# Patient Record
Sex: Female | Born: 1985 | Race: White | Hispanic: No | Marital: Single | Smoking: Never smoker
Health system: Southern US, Community
[De-identification: ages and names within clinical notes are randomized; demographics above are authoritative.]

## PROBLEM LIST (undated history)

## (undated) DIAGNOSIS — F1090 Alcohol use, unspecified, uncomplicated: Secondary | ICD-10-CM

## (undated) DIAGNOSIS — F411 Generalized anxiety disorder: Secondary | ICD-10-CM

## (undated) DIAGNOSIS — F1990 Other psychoactive substance use, unspecified, uncomplicated: Secondary | ICD-10-CM

---
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
## 2004-05-20 ENCOUNTER — Emergency Department (HOSPITAL_COMMUNITY): Admission: EM | Admit: 2004-05-20 | Discharge: 2004-05-21 | Payer: Self-pay | Admitting: *Deleted

## 2008-09-14 ENCOUNTER — Other Ambulatory Visit: Admission: RE | Admit: 2008-09-14 | Discharge: 2008-09-14 | Payer: Self-pay | Admitting: Family Medicine

## 2009-06-21 ENCOUNTER — Ambulatory Visit (HOSPITAL_COMMUNITY): Admission: RE | Admit: 2009-06-21 | Discharge: 2009-06-21 | Payer: Self-pay | Admitting: Obstetrics

## 2009-06-27 ENCOUNTER — Ambulatory Visit (HOSPITAL_COMMUNITY): Admission: RE | Admit: 2009-06-27 | Discharge: 2009-06-27 | Payer: Self-pay | Admitting: Obstetrics

## 2009-07-25 ENCOUNTER — Ambulatory Visit (HOSPITAL_COMMUNITY): Admission: RE | Admit: 2009-07-25 | Discharge: 2009-07-25 | Payer: Self-pay | Admitting: Obstetrics

## 2009-09-27 ENCOUNTER — Inpatient Hospital Stay (HOSPITAL_COMMUNITY): Admission: AD | Admit: 2009-09-27 | Discharge: 2009-10-03 | Payer: Self-pay | Admitting: Obstetrics

## 2009-09-30 ENCOUNTER — Encounter: Payer: Self-pay | Admitting: Obstetrics

## 2009-11-17 IMAGING — US US OB DETAIL+14 WK
1 series · 14 of 28 positions shown · non-contrast
Comparison: none

OBSTETRICAL ULTRASOUND:
 This ultrasound exam was performed in the [HOSPITAL] Ultrasound Department.  The OB US report was generated in the AS system, and faxed to the ordering physician.  This report is also available in [REDACTED] PACS.

[Series 1: us ob detail +14 wk · 53 acquisitions, 14 frames shown]
[im 2/53]
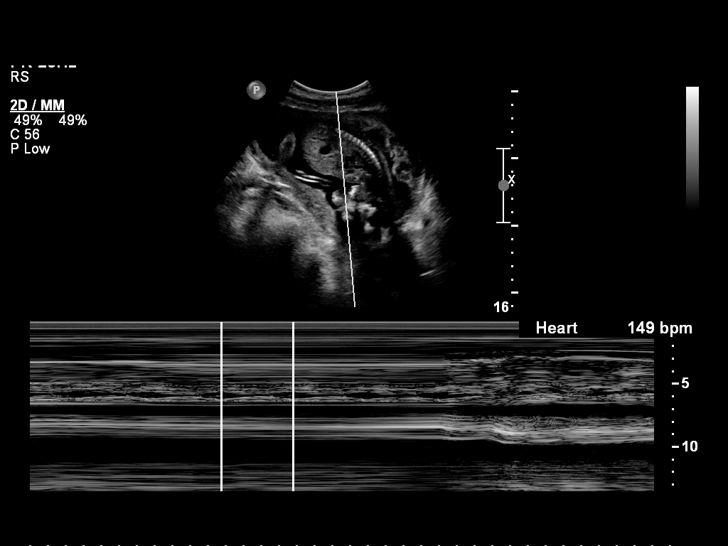
[im 6/53]
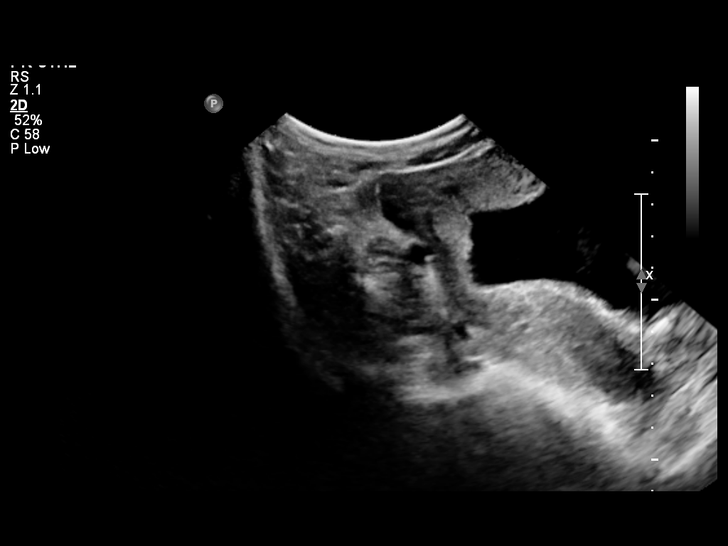
[im 10/53]
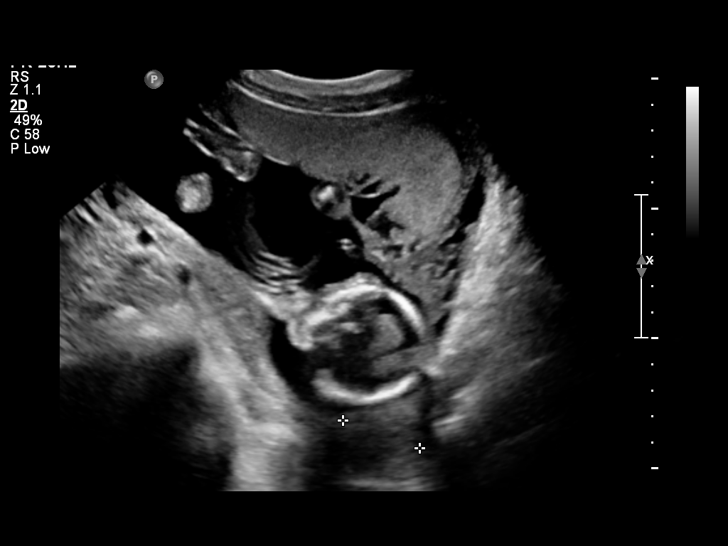
[im 14/53]
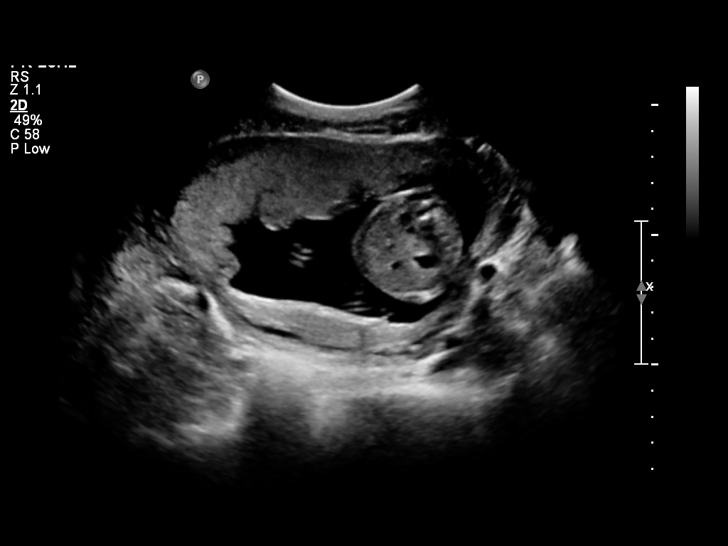
[im 18/53]
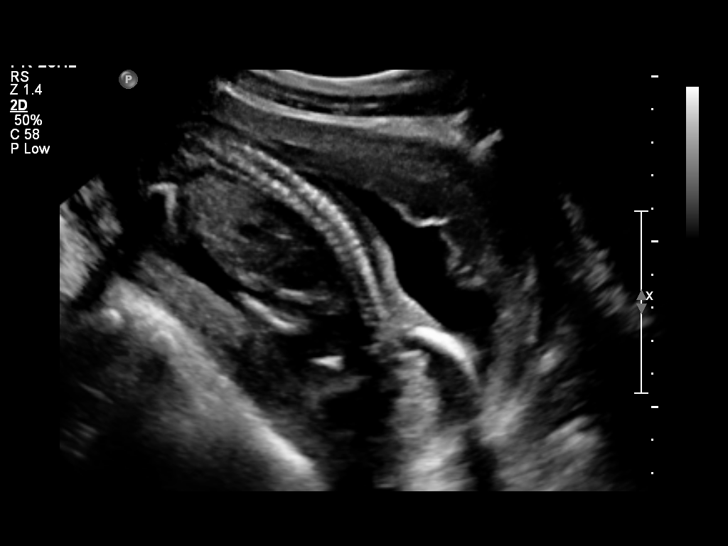
[im 22/53]
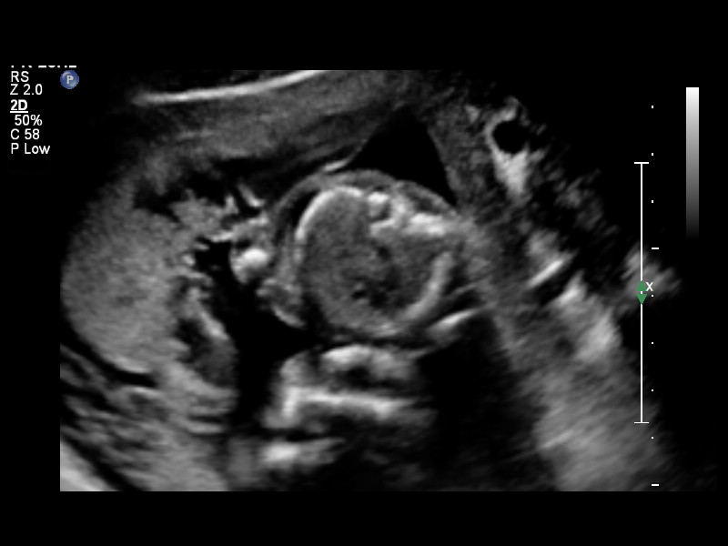
[im 26/53]
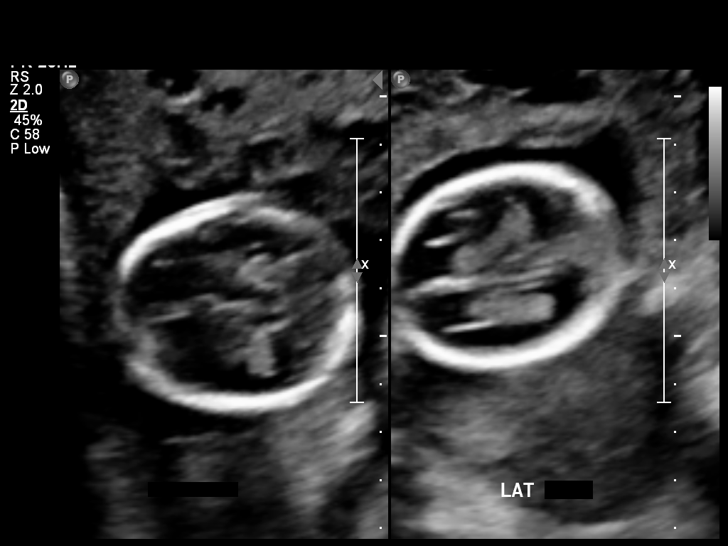
[im 29/53]
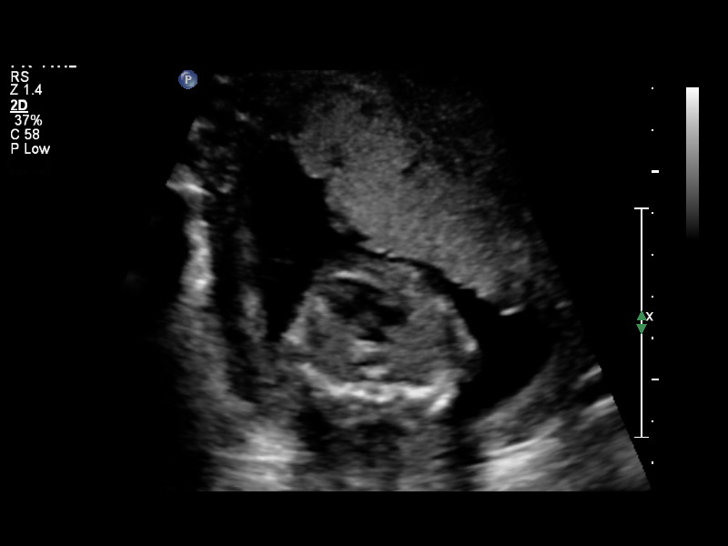
[im 33/53]
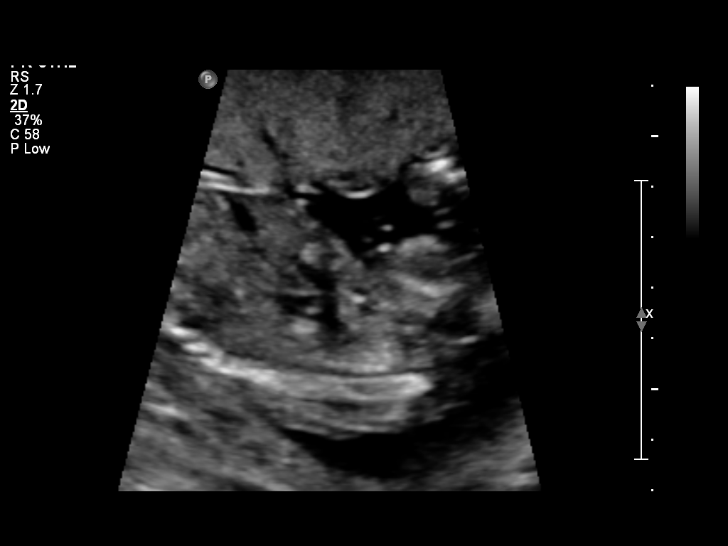
[im 37/53]
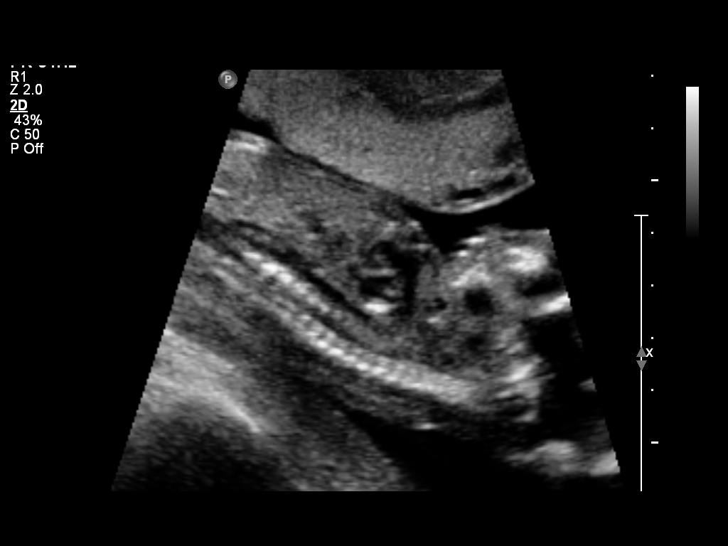
[im 41/53]
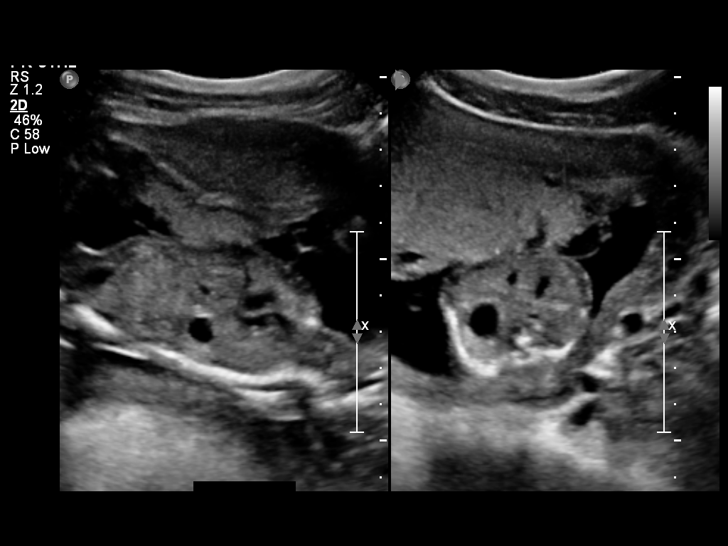
[im 45/53]
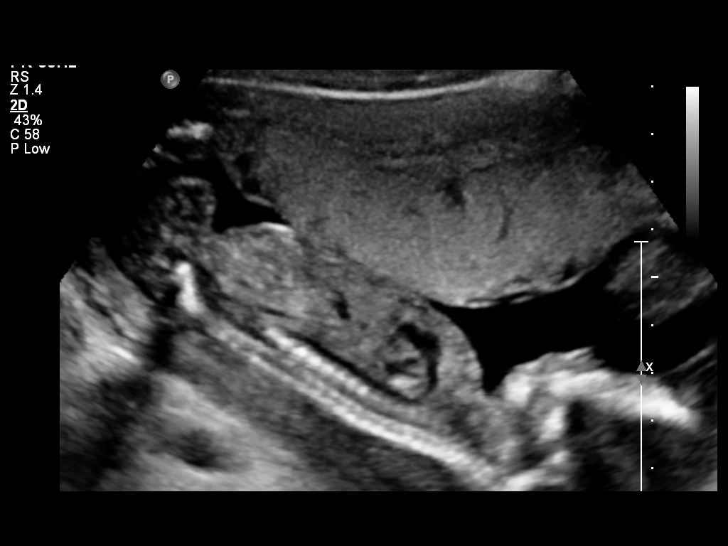
[im 49/53]
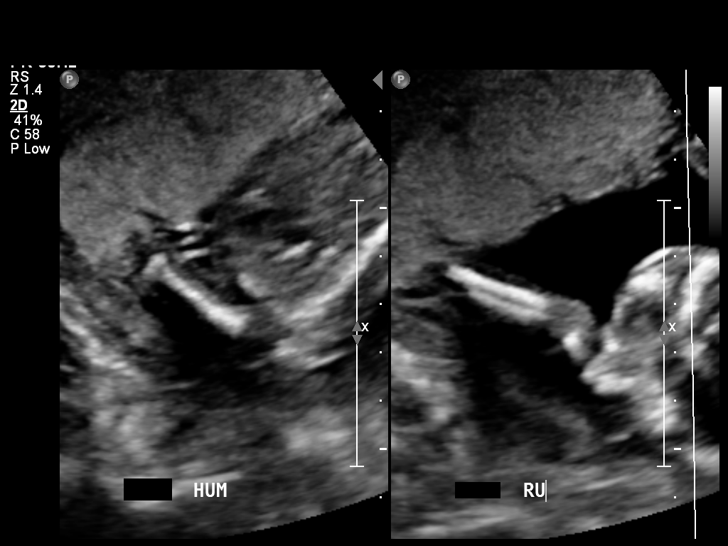
[im 53/53]
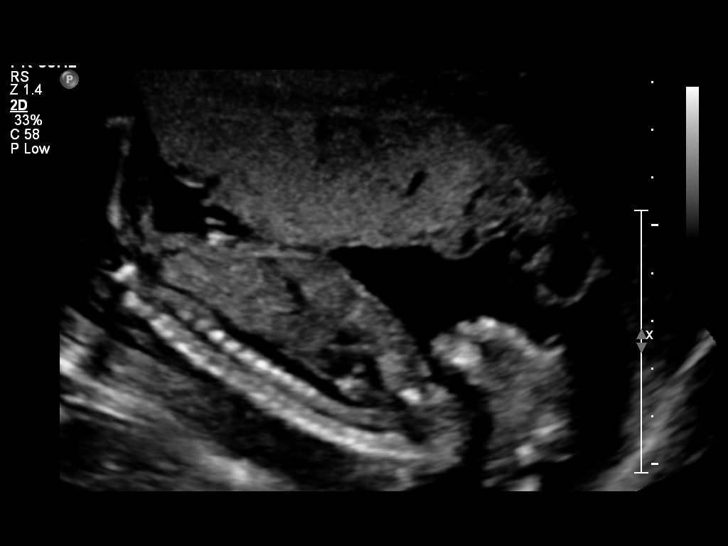

[14 of 28 positions shown; findings below may reference images not displayed]

IMPRESSION: See AS Obstetric US report.

## 2010-11-23 ENCOUNTER — Encounter: Payer: Self-pay | Admitting: Obstetrics

## 2011-02-04 LAB — MAGNESIUM
Magnesium: 3.3 mg/dL — ABNORMAL HIGH (ref 1.5–2.5)
Magnesium: 6.2 mg/dL (ref 1.5–2.5)
Magnesium: 6.4 mg/dL (ref 1.5–2.5)
Magnesium: 6.5 mg/dL (ref 1.5–2.5)
Magnesium: 6.5 mg/dL (ref 1.5–2.5)
Magnesium: 6.7 mg/dL (ref 1.5–2.5)

## 2011-02-04 LAB — COMPREHENSIVE METABOLIC PANEL
ALT: 30 U/L (ref 0–35)
Albumin: 2.9 g/dL — ABNORMAL LOW (ref 3.5–5.2)
Alkaline Phosphatase: 97 U/L (ref 39–117)
CO2: 22 mEq/L (ref 19–32)
Creatinine, Ser: 0.48 mg/dL (ref 0.4–1.2)
GFR calc Af Amer: >60 mL/min (ref >60)
Glucose, Bld: 89 mg/dL (ref 70–99)
Total Bilirubin: 0.2 mg/dL — ABNORMAL LOW (ref 0.3–1.2)

## 2011-02-04 LAB — CBC
HCT: 26.7 % — ABNORMAL LOW (ref 36.0–46.0)
Hemoglobin: 11.8 g/dL — ABNORMAL LOW (ref 12.0–15.0)
MCHC: 34 g/dL (ref 30.0–36.0)
MCV: 93.9 fL (ref 78.0–100.0)
Platelets: 186 10*3/uL (ref 150–400)
RBC: 2.91 MIL/uL — ABNORMAL LOW (ref 3.87–5.11)
RBC: 3.81 MIL/uL — ABNORMAL LOW (ref 3.87–5.11)
RDW: 13.5 % (ref 11.5–15.5)
WBC: 10.7 10*3/uL — ABNORMAL HIGH (ref 4.0–10.5)

## 2011-02-04 LAB — RH IMMUNE GLOB WKUP(>/=20WKS)(NOT WOMEN'S HOSP): Fetal Screen: NEGATIVE

## 2011-06-29 ENCOUNTER — Emergency Department (HOSPITAL_COMMUNITY): Payer: Self-pay

## 2011-06-29 ENCOUNTER — Emergency Department (HOSPITAL_COMMUNITY)
Admission: EM | Admit: 2011-06-29 | Discharge: 2011-06-30 | Disposition: A | Discharge status: Home / Self Care | Payer: Self-pay | Attending: Emergency Medicine | Admitting: Emergency Medicine

## 2011-06-29 DIAGNOSIS — R109 Unspecified abdominal pain: Secondary | ICD-10-CM | POA: Insufficient documentation

## 2011-06-29 DIAGNOSIS — N898 Other specified noninflammatory disorders of vagina: Secondary | ICD-10-CM | POA: Insufficient documentation

## 2011-06-29 DIAGNOSIS — N83209 Unspecified ovarian cyst, unspecified side: Secondary | ICD-10-CM | POA: Insufficient documentation

## 2011-06-29 LAB — URINALYSIS, ROUTINE W REFLEX MICROSCOPIC
Bilirubin Urine: NEGATIVE
Hgb urine dipstick: NEGATIVE
Ketones, ur: NEGATIVE mg/dL
Protein, ur: NEGATIVE mg/dL
Urobilinogen, UA: 0.2 mg/dL (ref 0.0–1.0)

## 2011-06-29 LAB — CBC
HCT: 33.5 % — ABNORMAL LOW (ref 36.0–46.0)
MCHC: 34 g/dL (ref 30.0–36.0)
MCV: 87.5 fL (ref 78.0–100.0)
RDW: 13.1 % (ref 11.5–15.5)

## 2011-06-29 LAB — BASIC METABOLIC PANEL
BUN: 10 mg/dL (ref 6–23)
Calcium: 9.5 mg/dL (ref 8.4–10.5)
Creatinine, Ser: 0.81 mg/dL (ref 0.50–1.10)
GFR calc Af Amer: >60 mL/min (ref >60)
GFR calc non Af Amer: >60 mL/min (ref >60)
Potassium: 3.4 mEq/L — ABNORMAL LOW (ref 3.5–5.1)

## 2011-06-29 LAB — DIFFERENTIAL
Basophils Relative: 0 % (ref 0–1)
Eosinophils Relative: 1 % (ref 0–5)
Monocytes Absolute: 0.7 10*3/uL (ref 0.1–1.0)
Monocytes Relative: 6 % (ref 3–12)
Neutrophils Relative %: 72 % (ref 43–77)

## 2011-06-30 LAB — GC/CHLAMYDIA PROBE AMP, GENITAL
Chlamydia, DNA Probe: NEGATIVE
GC Probe Amp, Genital: NEGATIVE

## 2011-06-30 MED ORDER — IOHEXOL 300 MG/ML  SOLN: 100.0000 mL | Freq: Once | INTRAMUSCULAR | Status: DC | PRN

## 2012-05-24 ENCOUNTER — Other Ambulatory Visit: Payer: Self-pay | Admitting: Obstetrics and Gynecology

## 2012-05-24 ENCOUNTER — Other Ambulatory Visit (HOSPITAL_COMMUNITY)
Admission: RE | Admit: 2012-05-24 | Discharge: 2012-05-24 | Disposition: A | Discharge status: Home / Self Care | Payer: Self-pay | Source: Ambulatory Visit | Attending: Obstetrics and Gynecology | Admitting: Obstetrics and Gynecology

## 2012-05-24 DIAGNOSIS — Z01419 Encounter for gynecological examination (general) (routine) without abnormal findings: Secondary | ICD-10-CM | POA: Insufficient documentation

## 2012-05-24 DIAGNOSIS — Z113 Encounter for screening for infections with a predominantly sexual mode of transmission: Secondary | ICD-10-CM | POA: Insufficient documentation

## 2012-05-24 DIAGNOSIS — N760 Acute vaginitis: Secondary | ICD-10-CM | POA: Insufficient documentation

## 2015-10-07 ENCOUNTER — Other Ambulatory Visit: Payer: Self-pay | Admitting: Physician Assistant

## 2015-10-07 ENCOUNTER — Ambulatory Visit
Admission: RE | Admit: 2015-10-07 | Discharge: 2015-10-07 | Disposition: A | Discharge status: Home / Self Care | Payer: BC Managed Care – PPO | Source: Ambulatory Visit | Attending: Physician Assistant | Admitting: Physician Assistant

## 2015-10-07 DIAGNOSIS — R059 Cough, unspecified: Secondary | ICD-10-CM

## 2015-10-07 DIAGNOSIS — R05 Cough: Secondary | ICD-10-CM

## 2016-01-30 IMAGING — CR DG CHEST 2V
2 series · 2 of 2 positions shown · non-contrast
Comparison: None in PACs

CLINICAL DATA: Cough and left anterior chest discomfort for the
past 6 weeks, no known injury

EXAM:
CHEST  2 VIEW

[w chest pa]
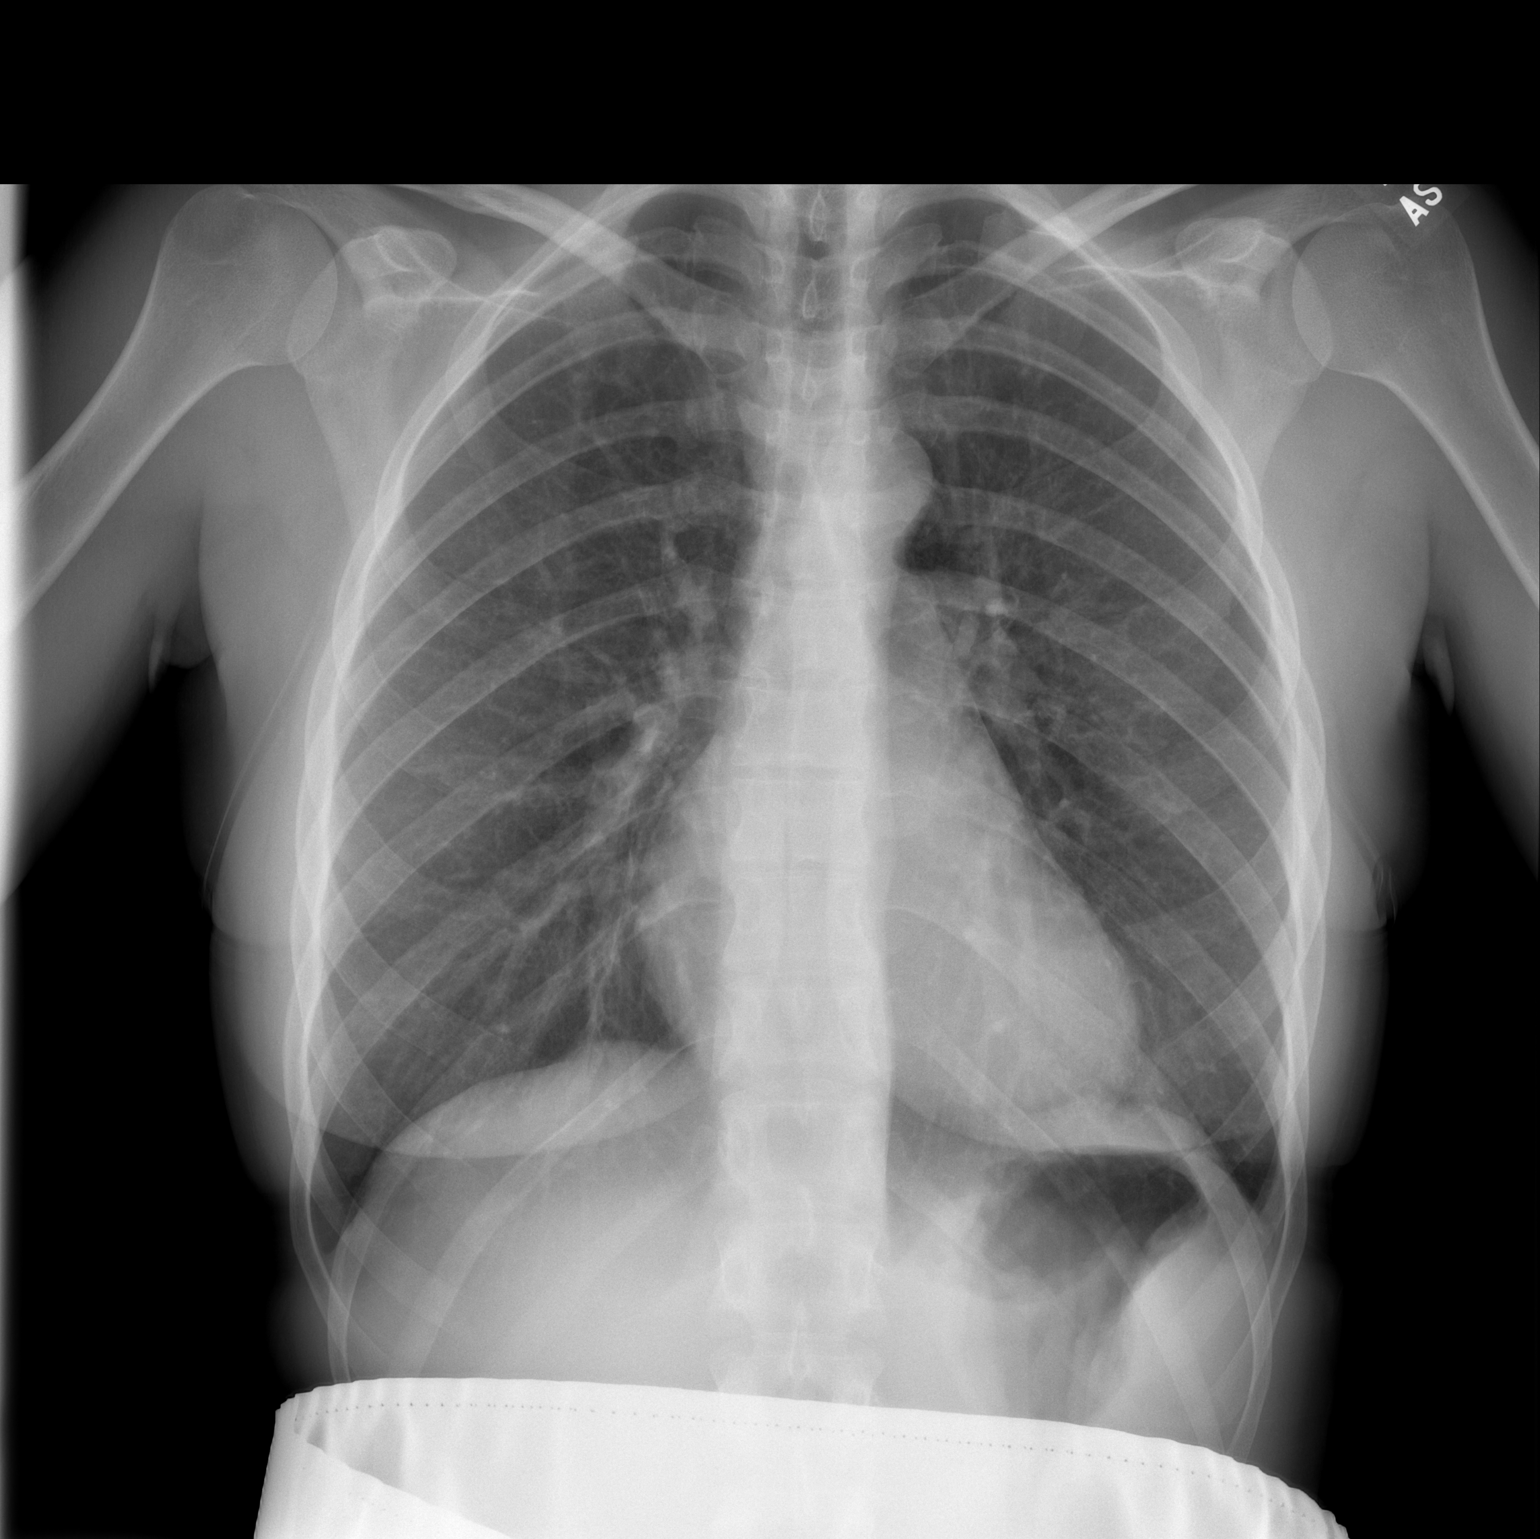

[w chest lat]
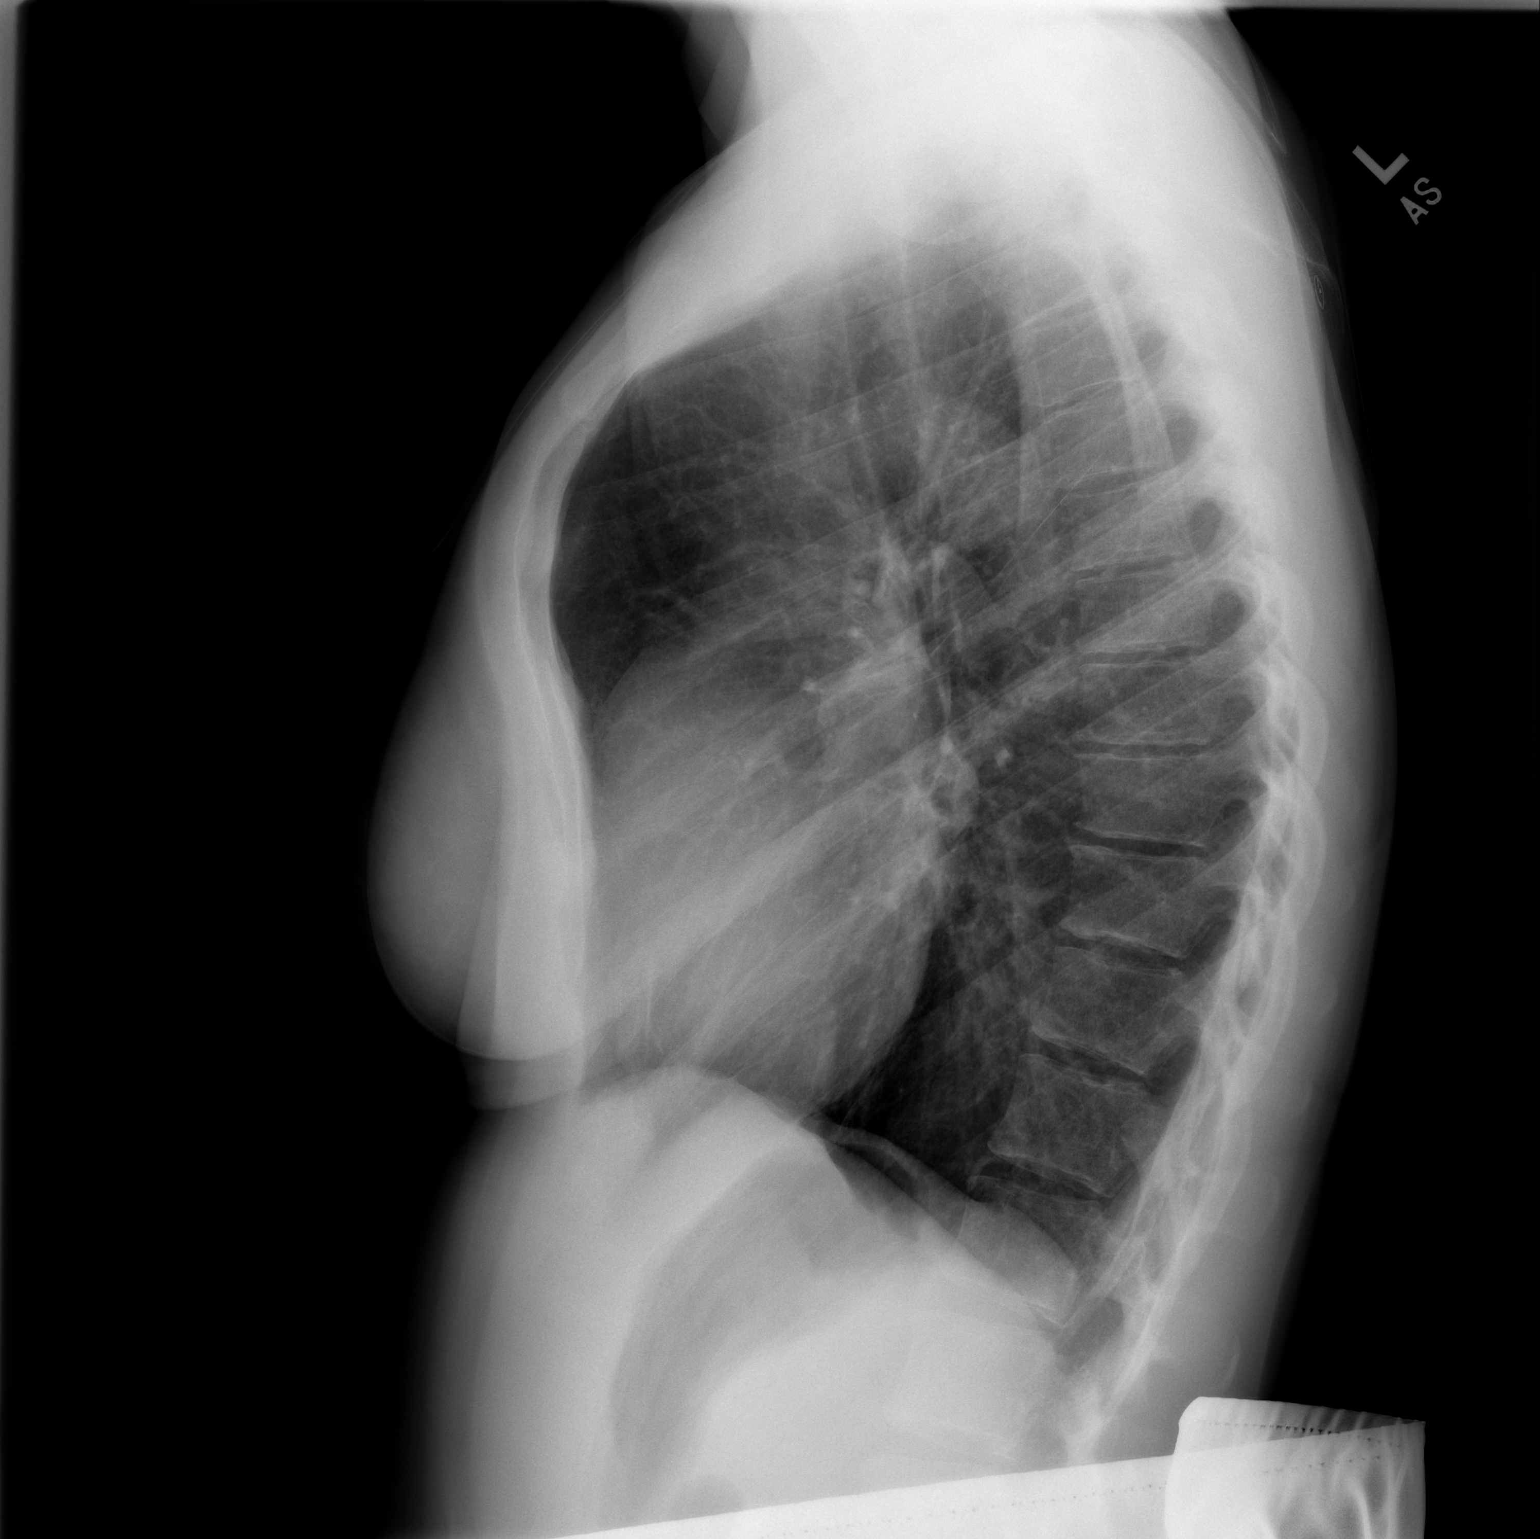

[2 of 2 positions shown; findings below may reference images not displayed]

FINDINGS: The lungs are hyperinflated with increased AP dimension of the
thorax and mild hemidiaphragm flattening. There is no focal
infiltrate. There is no pleural effusion, pneumothorax, or
pneumomediastinum. The heart and pulmonary vascularity are normal.
The mediastinum is normal in width. There is a pectus carinatum type
chest contour.
IMPRESSION: Hyperinflation may reflect reactive airway disease or COPD. There is
a mild pectus carinatum type chest contour.

## 2018-03-25 ENCOUNTER — Other Ambulatory Visit: Payer: Self-pay | Admitting: Physician Assistant

## 2018-03-25 ENCOUNTER — Ambulatory Visit
Admission: RE | Admit: 2018-03-25 | Discharge: 2018-03-25 | Disposition: A | Discharge status: Home / Self Care | Payer: BC Managed Care – PPO | Source: Ambulatory Visit | Attending: Physician Assistant | Admitting: Physician Assistant

## 2018-03-25 DIAGNOSIS — J111 Influenza due to unidentified influenza virus with other respiratory manifestations: Secondary | ICD-10-CM

## 2018-09-04 ENCOUNTER — Other Ambulatory Visit: Payer: Self-pay

## 2018-09-04 ENCOUNTER — Ambulatory Visit (HOSPITAL_COMMUNITY)
Admission: EM | Admit: 2018-09-04 | Discharge: 2018-09-04 | Disposition: A | Discharge status: Home / Self Care | Payer: BC Managed Care – PPO | Attending: Physician Assistant | Admitting: Physician Assistant

## 2018-09-04 ENCOUNTER — Telehealth (HOSPITAL_COMMUNITY): Payer: Self-pay | Admitting: *Deleted

## 2018-09-04 ENCOUNTER — Encounter (HOSPITAL_COMMUNITY): Payer: Self-pay | Admitting: Emergency Medicine

## 2018-09-04 DIAGNOSIS — S41111A Laceration without foreign body of right upper arm, initial encounter: Secondary | ICD-10-CM | POA: Diagnosis not present

## 2018-09-04 DIAGNOSIS — W25XXXA Contact with sharp glass, initial encounter: Secondary | ICD-10-CM | POA: Diagnosis not present

## 2018-09-04 DIAGNOSIS — Z23 Encounter for immunization: Secondary | ICD-10-CM | POA: Diagnosis not present

## 2018-09-04 MED ORDER — LIDOCAINE-EPINEPHRINE-TETRACAINE (LET) SOLUTION
NASAL | Status: AC
  Filled 2018-09-04: qty 3

## 2018-09-04 MED ORDER — TETANUS-DIPHTH-ACELL PERTUSSIS 5-2.5-18.5 LF-MCG/0.5 IM SUSP
INTRAMUSCULAR | Status: AC
  Filled 2018-09-04: qty 0.5

## 2018-09-04 MED ORDER — CEPHALEXIN 500 MG PO CAPS: 500.0000 mg | ORAL_CAPSULE | Freq: Three times a day (TID) | ORAL | 0 refills | Status: DC

## 2018-09-04 MED ORDER — TETANUS-DIPHTH-ACELL PERTUSSIS 5-2.5-18.5 LF-MCG/0.5 IM SUSP
0.5000 mL | Freq: Once | INTRAMUSCULAR | Status: AC
Start: 2018-09-04 — End: 2018-09-04
  Administered 2018-09-04: 0.5 mL via INTRAMUSCULAR

## 2018-09-04 MED ORDER — LIDOCAINE-EPINEPHRINE (PF) 2 %-1:200000 IJ SOLN
INTRAMUSCULAR | Status: AC
  Filled 2018-09-04: qty 20

## 2018-09-04 MED ORDER — CEPHALEXIN 500 MG PO CAPS: 500.0000 mg | ORAL_CAPSULE | Freq: Three times a day (TID) | ORAL | 0 refills | Status: AC
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
## 2018-09-04 NOTE — Discharge Instructions (Addendum)
COme back in ten days for suture removal.  Come in sooner if you need anything. Ibuprofen  800 mg every 8 hours as needed for pain.
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
## 2019-10-10 ENCOUNTER — Encounter (HOSPITAL_COMMUNITY): Payer: Self-pay

## 2019-10-10 ENCOUNTER — Other Ambulatory Visit: Payer: Self-pay

## 2019-10-10 ENCOUNTER — Ambulatory Visit (HOSPITAL_COMMUNITY)
Admission: EM | Admit: 2019-10-10 | Discharge: 2019-10-10 | Disposition: A | Discharge status: Home / Self Care | Payer: BC Managed Care – PPO | Attending: Emergency Medicine | Admitting: Emergency Medicine

## 2019-10-10 DIAGNOSIS — S0181XA Laceration without foreign body of other part of head, initial encounter: Secondary | ICD-10-CM | POA: Diagnosis not present

## 2019-10-10 MED ORDER — TETANUS-DIPHTH-ACELL PERTUSSIS 5-2.5-18.5 LF-MCG/0.5 IM SUSP
INTRAMUSCULAR | Status: AC
  Filled 2019-10-10: qty 0.5

## 2019-10-10 MED ORDER — IBUPROFEN 600 MG PO TABS: 600.0000 mg | ORAL_TABLET | Freq: Four times a day (QID) | ORAL | 0 refills | Status: DC | PRN

## 2019-10-10 MED ORDER — BACITRACIN ZINC 500 UNIT/GM EX OINT
TOPICAL_OINTMENT | Freq: Two times a day (BID) | CUTANEOUS | Status: DC
  Administered 2019-10-10: 19:00:00 via TOPICAL

## 2019-10-10 MED ORDER — BACITRACIN ZINC 500 UNIT/GM EX OINT
TOPICAL_OINTMENT | CUTANEOUS | Status: AC
  Filled 2019-10-10: qty 9

## 2019-10-10 MED ORDER — TETANUS-DIPHTH-ACELL PERTUSSIS 5-2.5-18.5 LF-MCG/0.5 IM SUSP
0.5000 mL | Freq: Once | INTRAMUSCULAR | Status: AC
  Administered 2019-10-10: 19:00:00 0.5 mL via INTRAMUSCULAR

## 2019-10-10 MED ORDER — LIDOCAINE-EPINEPHRINE (PF) 2 %-1:200000 IJ SOLN
INTRAMUSCULAR | Status: AC
  Filled 2019-10-10: qty 20
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
## 2019-10-10 NOTE — Discharge Instructions (Addendum)
We have updated your tetanus today.  Keep this clean and dry for the next 48 to 72 hours.  Then start getting it dry time.  600 mg ibuprofen by with 1 g of Tylenol 3-4 times a day as needed for pain, ice to the area for 20 minutes at a time.  When here in 10 days for suture removal, sooner for any signs of infection.

## 2020-01-06 ENCOUNTER — Ambulatory Visit: Payer: BC Managed Care – PPO | Attending: Internal Medicine

## 2020-01-06 DIAGNOSIS — Z23 Encounter for immunization: Secondary | ICD-10-CM | POA: Insufficient documentation

## 2020-01-06 NOTE — Progress Notes (Signed)
   Covid-19 Vaccination Clinic  Name:  Anne Stewart    MRN: [id number] DOB: [birth year]  01/06/2020  Anne Stewart was observed post Covid-19 immunization for 15 minutes without incident. She was provided with Vaccine Information Sheet and instruction to access the V-Safe system.   Anne Stewart was instructed to call 911 with any severe reactions post vaccine: Marland Kitchen Difficulty breathing  . Swelling of face and throat  . A fast heartbeat  . A bad rash all over body  . Dizziness and weakness   Immunizations Administered    Name Date Dose VIS Date Route   Pfizer COVID-19 Vaccine 01/06/2020  5:24 PM 0.3 mL 10/13/2019 Intramuscular   Manufacturer: [employer], [employer]   Lot: [id number]   NDC: [address]-6

## 2020-01-27 ENCOUNTER — Ambulatory Visit: Payer: BC Managed Care – PPO | Attending: Internal Medicine

## 2020-01-27 DIAGNOSIS — Z23 Encounter for immunization: Secondary | ICD-10-CM

## 2020-01-27 NOTE — Progress Notes (Signed)
   Covid-19 Vaccination Clinic  Name:  Anne Stewart    MRN: [id number] DOB: 1986-03-24  01/27/2020  Anne Stewart was observed post Covid-19 immunization for 15 minutes without incident. She was provided with Vaccine Information Sheet and instruction to access the V-Safe system.   Anne Stewart was instructed to call 911 with any severe reactions post vaccine: Marland Kitchen Difficulty breathing  . Swelling of face and throat  . A fast heartbeat  . A bad rash all over body  . Dizziness and weakness   Immunizations Administered    Name Date Dose VIS Date Route   Pfizer COVID-19 Vaccine 01/27/2020  4:57 PM 0.3 mL 10/13/2019 Intramuscular   Manufacturer: [employer], [employer]   Lot: [id number]   NDC: [address]-1

## 2020-09-15 ENCOUNTER — Ambulatory Visit: Payer: BC Managed Care – PPO | Attending: Internal Medicine

## 2020-09-15 DIAGNOSIS — Z23 Encounter for immunization: Secondary | ICD-10-CM

## 2020-09-15 NOTE — Progress Notes (Signed)
° °  Covid-19 Vaccination Clinic  Name:  Anne Stewart    MRN: [id number] DOB: 1986-03-05  09/15/2020  [name] was observed post Covid-19 immunization for 15 minutes without incident. She was provided with Vaccine Information Sheet and instruction to access the V-Safe system.   [name] was instructed to call 911 with any severe reactions post vaccine:  Difficulty breathing   Swelling of face and throat   A fast heartbeat   A bad rash all over body   Dizziness and weakness   Immunizations Administered    Name Date Dose VIS Date Route   Pfizer COVID-19 Vaccine 09/15/2020  1:20 PM 0.3 mL 08/21/2020 Intramuscular   Manufacturer: [employer], [employer]   Lot: [id number]   NDC: [address]-8

## 2022-12-22 ENCOUNTER — Other Ambulatory Visit: Payer: Self-pay | Admitting: Family Medicine

## 2022-12-22 DIAGNOSIS — S0993XA Unspecified injury of face, initial encounter: Secondary | ICD-10-CM

## 2022-12-24 ENCOUNTER — Ambulatory Visit
Admission: RE | Admit: 2022-12-24 | Discharge: 2022-12-24 | Disposition: A | Discharge status: Home / Self Care | Payer: BC Managed Care – PPO | Source: Ambulatory Visit | Attending: Family Medicine | Admitting: Family Medicine

## 2022-12-24 DIAGNOSIS — S0993XA Unspecified injury of face, initial encounter: Secondary | ICD-10-CM

## 2023-01-25 ENCOUNTER — Emergency Department (HOSPITAL_COMMUNITY)
Admission: EM | Admit: 2023-01-25 | Discharge: 2023-01-26 | Disposition: A | Discharge status: Other Acute Inpatient Hospital | Payer: BC Managed Care – PPO | Source: Home / Self Care | Attending: Emergency Medicine | Admitting: Emergency Medicine

## 2023-01-25 ENCOUNTER — Other Ambulatory Visit: Payer: Self-pay

## 2023-01-25 DIAGNOSIS — F1014 Alcohol abuse with alcohol-induced mood disorder: Secondary | ICD-10-CM | POA: Insufficient documentation

## 2023-01-25 DIAGNOSIS — R259 Unspecified abnormal involuntary movements: Secondary | ICD-10-CM | POA: Insufficient documentation

## 2023-01-25 DIAGNOSIS — Z1152 Encounter for screening for COVID-19: Secondary | ICD-10-CM | POA: Insufficient documentation

## 2023-01-25 DIAGNOSIS — Y908 Blood alcohol level of 240 mg/100 ml or more: Secondary | ICD-10-CM | POA: Insufficient documentation

## 2023-01-25 DIAGNOSIS — R45851 Suicidal ideations: Secondary | ICD-10-CM | POA: Insufficient documentation

## 2023-01-25 DIAGNOSIS — F1020 Alcohol dependence, uncomplicated: Secondary | ICD-10-CM | POA: Diagnosis present

## 2023-01-25 DIAGNOSIS — Z046 Encounter for general psychiatric examination, requested by authority: Secondary | ICD-10-CM

## 2023-01-25 DIAGNOSIS — F1024 Alcohol dependence with alcohol-induced mood disorder: Secondary | ICD-10-CM | POA: Diagnosis not present

## 2023-01-25 DIAGNOSIS — F1094 Alcohol use, unspecified with alcohol-induced mood disorder: Secondary | ICD-10-CM | POA: Diagnosis present

## 2023-01-25 LAB — COMPREHENSIVE METABOLIC PANEL
ALT: 36 U/L (ref 0–44)
AST: 38 U/L (ref 15–41)
Albumin: 4.6 g/dL (ref 3.5–5.0)
Alkaline Phosphatase: 111 U/L (ref 38–126)
Anion gap: 18 — ABNORMAL HIGH (ref 5–15)
BUN: 7 mg/dL (ref 6–20)
CO2: 20 mmol/L — ABNORMAL LOW (ref 22–32)
Calcium: 8.8 mg/dL — ABNORMAL LOW (ref 8.9–10.3)
Chloride: 104 mmol/L (ref 98–111)
Creatinine, Ser: 0.61 mg/dL (ref 0.44–1.00)
GFR, Estimated: >60 mL/min (ref >60)
Glucose, Bld: 105 mg/dL — ABNORMAL HIGH (ref 70–99)
Potassium: 3.9 mmol/L (ref 3.5–5.1)
Sodium: 142 mmol/L (ref 135–145)
Total Bilirubin: 0.5 mg/dL (ref 0.3–1.2)
Total Protein: 8.3 g/dL — ABNORMAL HIGH (ref 6.5–8.1)

## 2023-01-25 LAB — CBC
HCT: 40.8 % (ref 36.0–46.0)
Hemoglobin: 14 g/dL (ref 12.0–15.0)
MCH: 31.5 pg (ref 26.0–34.0)
MCHC: 34.3 g/dL (ref 30.0–36.0)
MCV: 91.9 fL (ref 80.0–100.0)
Platelets: 306 10*3/uL (ref 150–400)
RBC: 4.44 MIL/uL (ref 3.87–5.11)
RDW: 13.9 % (ref 11.5–15.5)
WBC: 7.6 10*3/uL (ref 4.0–10.5)
nRBC: 0 % (ref 0.0–0.2)

## 2023-01-25 LAB — I-STAT BETA HCG BLOOD, ED (MC, WL, AP ONLY): I-stat hCG, quantitative: <5 m[IU]/mL (ref <5)

## 2023-01-25 LAB — SALICYLATE LEVEL: Salicylate Lvl: <7 mg/dL — ABNORMAL LOW (ref 7.0–30.0)

## 2023-01-25 LAB — ACETAMINOPHEN LEVEL: Acetaminophen (Tylenol), Serum: <10 ug/mL — ABNORMAL LOW (ref 10–30)

## 2023-01-25 LAB — ETHANOL: Alcohol, Ethyl (B): 413 mg/dL (ref <10)

## 2023-01-25 MED ORDER — ACETAMINOPHEN 325 MG PO TABS: 650.0000 mg | ORAL_TABLET | ORAL | Status: DC | PRN

## 2023-01-25 MED ORDER — ONDANSETRON HCL 4 MG PO TABS: 4.0000 mg | ORAL_TABLET | Freq: Three times a day (TID) | ORAL | Status: DC | PRN
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
## 2023-01-25 NOTE — ED Provider Notes (Signed)
[place] EMERGENCY DEPARTMENT AT [facility] Provider Note   CSN: [id number] Arrival date & time: 01/25/23  2149     History  Chief Complaint  Patient presents with   Suicidal    Anne Stewart is a 37 y.o. female.  Patient presents to the emergency department via law enforcement due to involuntary commitment.  Patient's parents filed IVC paperwork earlier today.  Patient's parents reported that the patient was post to go to an [place] but did not go.  She has reportedly been binge drinking since last Wednesday.  She admits to alcohol use today and when asked as to how much she drinks daily she states "too much."  She reportedly told her parents that she would like to slit her wrist several times tonight prior to the patient is taking out the IVC paperwork.  Upon my assessment the patient denies SI, HI, hallucinations.  Patient is tearful stating she wants to go home.  She denies any medical complaints at this time.  HPI     Home Medications Prior to Admission medications   Medication Sig Start Date End Date Taking? Authorizing Provider  ibuprofen (ADVIL) 600 MG tablet Take 1 tablet (600 mg total) by mouth every 6 (six) hours as needed. 10/10/19   Melynda Ripple, MD      Allergies    Patient has no known allergies.    Review of Systems   Review of Systems  Physical Exam Updated Vital Signs BP (!) 170/104 (BP Location: Right Arm)   Pulse (!) 106   Temp 98.4 F (36.9 C) (Oral)   Resp 20   LMP  (LMP Unknown)   SpO2 96%  Physical Exam HENT:     Head: Normocephalic and atraumatic.  Eyes:     Pupils: Pupils are equal, round, and reactive to light.  Pulmonary:     Effort: Pulmonary effort is normal. No respiratory distress.  Musculoskeletal:        General: No signs of injury.     Cervical back: Normal range of motion.  Skin:    General: Skin is dry.  Neurological:     Mental Status: She is alert.  Psychiatric:        Speech: Speech  normal.        Behavior: Behavior normal.     ED Results / Procedures / Treatments   Labs (all labs ordered are listed, but only abnormal results are displayed) Labs Reviewed  COMPREHENSIVE METABOLIC PANEL - Abnormal; Notable for the following components:      Result Value   CO2 20 (*)    Glucose, Bld 105 (*)    Calcium 8.8 (*)    Total Protein 8.3 (*)    Anion gap 18 (*)    All other components within normal limits  ETHANOL - Abnormal; Notable for the following components:   Alcohol, Ethyl (B) 413 (*)    All other components within normal limits  SALICYLATE LEVEL - Abnormal; Notable for the following components:   Salicylate Lvl [id number] (*)    All other components within normal limits  ACETAMINOPHEN LEVEL - Abnormal; Notable for the following components:   Acetaminophen (Tylenol), Serum <10 (*)    All other components within normal limits  ETHANOL - Abnormal; Notable for the following components:   Alcohol, Ethyl (B) 288 (*)    All other components within normal limits  CBC  RAPID URINE DRUG SCREEN, HOSP PERFORMED  I-STAT BETA HCG BLOOD, ED ([facility], [facility],  [facility] ONLY)    EKG None  Radiology No results found.  Procedures Procedures    Medications Ordered in ED Medications  ondansetron (ZOFRAN) tablet 4 mg (has no administration in time range)  acetaminophen (TYLENOL) tablet 650 mg (has no administration in time range)    ED Course/ Medical Decision Making/ A&P                             Medical Decision Making Amount and/or Complexity of Data Reviewed Labs: ordered.  Risk OTC drugs. Prescription drug management.   Patient presents with a chief complaint of needing medical clearance due to IVC.  Medical clearance labs have been ordered.  Patient denies SI, HI, hallucinations at this time.  Patient was evaluated by behavioral health who recommended that the patient get inpatient psychiatric treatment  Patient's initial alcohol level was 413.  Initial medical  workup otherwise grossly unremarkable.  Plan to have patient metabolize while being monitored with CIWA precautions.  Once patient's alcohol level is down significantly, assuming no signs of withdrawal, patient will be medically clear for psychiatric placement. Patient care transferred to Alyse Low, PA at shift handoff for continuation of medical clearance        Final Clinical Impression(s) / ED Diagnoses Final diagnoses:  Suicidal ideation    Rx / DC Orders ED Discharge Orders     None         Ronny Bacon 01/26/23 [id number]    Sherwood Gambler, MD 01/26/23 1622

## 2023-01-25 NOTE — BH Assessment (Signed)
Comprehensive Clinical Assessment (CCA) Note  01/26/2023 Anne Stewart [id number]  Disposition: Clinical report given to Anne Goldsmith, NP who recommends inpatient admission once medically cleared. Anne June, PA-C and RN Doylene Bode updated on recommendation.  The patient demonstrates the following risk factors for suicide: Chronic risk factors for suicide include: substance use disorder. Acute risk factors for suicide include: N/A. Protective factors for this patient include: responsibility to others (children, family). Considering these factors, the overall suicide risk at this point appears to be moderate. Patient is not appropriate for outpatient follow up.  Anne Stewart is a 37 y.o. single female who presents to [name] ED involuntarily due to alcoholism and suicidal ideation with a plan to slit her wrists. Pt IVC'D by her stepfather Anne Stewart [phone]. Pt is guarded and provides limited information throughout the assessment. Pt asks Clinician "what do I need to say to go home?"  Per IVC "respondent has been diagnosed with alcoholism. Respondent refuses to get help and her solution is to commit self-harm. Respondent admitted today that she was going to slit her wrist. Without intervention the respondent could harm herself or others."   When asked what led to the ED visit, Pt states "I said something to my parents about hurting myself. Maybe I said I thought about hurting myself, but I didn't do anything". Pt denies any previous suicide attempts or self-harming behaviors. Pt denies any depressive symptoms or feelings of anxiousness. Pt reports her appetite and sleep are "fine". Pt denies any recent manic symptoms. Pt denies current SI, HI, auditory or visual hallucinations. Pt reports she drank 1 glass of wine 8 hours ago and denies additional substance use. Pt's initial alcohol level is 413. Pt reports previous withdrawal symptoms have included sweating, shaking, and  throwing up. Pt denies a history of seizures. Pt reports she will go days without drinking, then binge drink for days. She says she attended rehab for alcohol use, about 3 years ago. . Pt states her longest period of sobriety has been 18 days.  Pt denies having stressors, however she shares she is worried about one of her children, although she refuses to elaborate. Pt reports she lives alone and is employed as a Pharmacist, hospital.  Pt identifies her parents as her primary supports. Pt denies a history of abuse or trauma.   Pt reports she has a therapist and psychiatrist; however, she refuses to provide their names or when she last had a session. Pt states "I would like to keep that private". Pt says she is prescribed Gabapentin that she takes regularly. Pt denies any previous inpatient hospitalizations.  Pt is dressed in scrubs, alert and oriented with low speech. Pt's eye contact is good, and she is tearful, constantly asking to return home. Pt is guarded throughout the assessment, refusing to provide details or answer some questions. Pt lacks insight to her mental health and minimizes symptoms. There is no indication Pt is responding to internal stimuli. Pt is calm throughout the assessment.   Chief Complaint:  Chief Complaint  Patient presents with   Suicidal   Visit Diagnosis: Alcohol-induced depressive disorder, with moderate or severe use disorder    CCA Screening, Triage and Referral (STR)  Patient Reported Information How did you hear about [place]? Legal System  What Is the Reason for Your Visit/Call Today? Anne Stewart is a 37 y.o. signle female who presents involuntarily via law enforcement due to [name] with a plan to slit her wrists and alcoholism. Pt denies any symptoms  of depression, anxiousness or mania. Pt reports she has a therapist and psychiatrist, however refused to provide details. Pt reports she is prescribed gabapentin that she takes regularly. Pt reports drinking 1 glass of wine 8  hours ago. Pt denies additional substance use. Pt denies current SI, HI, AH/VH. Pt denies access to guns or weapons.  How Long Has This Been Causing You Problems? <Week  What Do You Feel Would Help You the Most Today? Treatment for Depression or other mood problem; Alcohol or Drug Use Treatment   Have You Recently Had Any Thoughts About Hurting Yourself? Yes  Are You Planning to Commit Suicide/Harm Yourself At This time? No   Flowsheet Row ED from 01/25/2023 in [facility] Emergency Department at [place] Moderate Risk       Have you Recently Had Thoughts About [place]? No  Are You Planning to Harm Someone at This Time? No  Explanation: N/A   Have You Used Any Alcohol or Drugs in the Past 24 Hours? Yes  What Did You Use and How Much? Pt reports drinking 1 glass of wine   Do You Currently Have a Therapist/Psychiatrist? Yes  Name of Therapist/Psychiatrist: Name of Therapist/Psychiatrist: Pt refused to provide   Have You Been Recently Discharged From Any Office Practice or Programs? No  Explanation of Discharge From Practice/Program: N/A     CCA Screening Triage Referral Assessment Type of Contact: Tele-Assessment  Telemedicine Service Delivery: Telemedicine service delivery: This service was provided via telemedicine using a 2-way, interactive audio and video technology  Is this Initial or Reassessment? Is this Initial or Reassessment?: Initial Assessment  Date Telepsych consult ordered in [facility]:  Date Telepsych consult ordered in [facility]: 01/25/23  Time Telepsych consult ordered in [facility]:  Time Telepsych consult ordered in [facility]: 2248  Location of Assessment: [facility] ED  Provider Location: [facility] Assessment Services   Collateral Involvement: Pt refused   Does Patient Have a [place]? No  Legal Guardian Contact Information: N/A  Copy of Legal Guardianship Form: -- (N/A)  Legal Guardian Notified of  Arrival: -- (N/A)  Legal Guardian Notified of Pending Discharge: -- (N/A)  If Minor and Not Living with Parent(s), Who has Custody? N/A  Is CPS involved or ever been involved? Never  Is APS involved or ever been involved? Never   Patient Determined To Be At Risk for Harm To Self or Others Based on Review of Patient Reported Information or Presenting Complaint? Yes, for Self-Harm (Per IVC "respondant admitted today that she was going to slit her wrist")  Method: Plan with intent and identified person (denies HI)  Availability of Means: No access or NA (denies HI)  Intent: Clearly intends on inflicting harm that could cause death (denies HI)  Notification Required: No need or identified person (denies HI)  Additional Information for Danger to Others Potential: -- (N/A)  Additional Comments for Danger to Others Potential: N/A  Are There Guns or Other Weapons in Your Home? No  Types of Guns/Weapons: N/A  Are These Weapons Safely Secured?                            -- (N/A)  Who Could Verify You Are Able To Have These Secured: N/A  Do You Have any Outstanding Charges, Pending Court Dates, Parole/Probation? Pt denies  Contacted To Inform of Risk of Harm To Self or Others: Event organiser  Does Patient Present under Involuntary Commitment? Yes    [place] of Residence: [place]   Patient Currently Receiving the Following Services: Medication Management; Individual Therapy   Determination of Need: Emergent (2 hours)   Options For Referral: Inpatient Hospitalization     CCA Biopsychosocial Patient Reported Schizophrenia/Schizoaffective Diagnosis in Past: No   Strengths: Pt is engaged in therapeutic services   Mental Health Symptoms Depression:   None   Duration of Depressive symptoms:    Mania:   None   Anxiety:    None   Psychosis:   None   Duration of Psychotic symptoms:    Trauma:   None   Obsessions:   None   Compulsions:   None    Inattention:   None   Hyperactivity/Impulsivity:   None   Oppositional/Defiant Behaviors:   None   Emotional Irregularity:   None   Other Mood/Personality Symptoms:   N/A    Mental Status Exam Appearance and self-care  Stature:   Average   Weight:   Average weight   Clothing:   -- (hospital scrubs)   Grooming:   Normal   Cosmetic use:   None   Posture/gait:   Normal   Motor activity:   Not Remarkable   Sensorium  Attention:   Normal   Concentration:   Normal   Orientation:   X5   Recall/memory:   Normal   Affect and Mood  Affect:   Flat   Mood:   Irritable   Relating  Eye contact:   Normal   Facial expression:   Constricted   Attitude toward examiner:   Guarded   Thought and Language  Speech flow:  Normal   Thought content:   Appropriate to Mood and Circumstances   Preoccupation:   None   Hallucinations:   None   Organization:   Linear   Transport planner of Knowledge:   Average   Intelligence:   Average   Abstraction:   Normal   Judgement:   Fair   Art therapist:   Adequate   Insight:   Lacking   Decision Making:   Normal   Social Functioning  Social Maturity:   Impulsive   Social Judgement:   Normal   Stress  Stressors:   Other (Comment) (Pt denies)   Coping Ability:   Overwhelmed   Skill Deficits:   None   Supports:   Family     Religion: Religion/Spirituality Are You A Religious Person?: No How Might This Affect Treatment?: N/A  Leisure/Recreation: Leisure / Recreation Do You Have Hobbies?: No  Exercise/Diet: Exercise/Diet Do You Exercise?: No Have You Gained or Lost A Significant Amount of Weight in the Past Six Months?: No Do You Follow a Special Diet?: No Do You Have Any Trouble Sleeping?: No   CCA Employment/Education Employment/Work Situation: Employment / Work Situation Employment Situation: Employed Work Stressors: Pt denies Patient's Job has  Been Impacted by Current Illness: No Has Patient ever Been in Passenger transport manager?: No  Education: Education Is Patient Currently Attending School?: No Last Grade Completed: 12 Did You Nutritional therapist?: Yes What Type of College Degree Do you Have?: Buyer, 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 Did You Have An Individualized Education Program (IIEP): No Did You Have Any Difficulty At [employer]?: No Patient's Education Has Been Impacted by Current Illness: No   CCA Family/Childhood History Family and Relationship History: Family history Marital status: Single Does patient have children?: Yes How many children?: 2 How is patient's relationship with  their children?: Pt reports a good relationship with children  Childhood History:  Childhood History By whom was/is the patient raised?: Both parents Did patient suffer any verbal/emotional/physical/sexual abuse as a child?: No Did patient suffer from severe childhood neglect?: No Has patient ever been sexually abused/assaulted/raped as an adolescent or adult?: No Was the patient ever a victim of a crime or a disaster?: No Witnessed domestic violence?: No Has patient been affected by domestic violence as an adult?: No       CCA Substance Use Alcohol/Drug Use: Alcohol / Drug Use Pain Medications: See MAR Prescriptions: See MAR Over the Counter: See MAR History of alcohol / drug use?: Yes Longest period of sobriety (when/how long): 18 days Negative Consequences of Use: Personal relationships Withdrawal Symptoms: Tremors, Sweats, Nausea / Vomiting Substance #1 Name of Substance 1: Alcohol 1 - Age of First Use: 37 years old 1 - Amount (size/oz): Pt reports 1 glass of wine 1 - Frequency: Daily 1 - Duration: Ongoing 1 - Last Use / Amount: 8 hours ago 1 - Method of Aquiring: Purchase 1- Route of Use: Orally                       ASAM's:  Six Dimensions of Multidimensional Assessment  Dimension 1:  Acute Intoxication and/or Withdrawal  Potential:   Dimension 1:  Description of individual's past and current experiences of substance use and withdrawal: Pt reprots shaking, sweats and throwing up.  Dimension 2:  Biomedical Conditions and Complications:   Dimension 2:  Description of patient's biomedical conditions and  complications: Pt reports no physical pain or discomfort at this time.  Dimension 3:  Emotional, Behavioral, or Cognitive Conditions and Complications:  Dimension 3:  Description of emotional, behavioral, or cognitive conditions and complications: Pt expressed SI  Dimension 4:  Readiness to Change:  Dimension 4:  Description of Readiness to Change criteria: Pt expresses no desire to seek treatment at this time  Dimension 5:  Relapse, Continued use, or Continued Problem Potential:  Dimension 5:  Relapse, continued use, or continued problem potential critiera description: Pt minimizes there is a poblem  Dimension 6:  Recovery/Living Environment:  Dimension 6:  Recovery/Iiving environment criteria description: Pt has support of her mother and father.  ASAM Severity Score: ASAM's Severity Rating Score: 6  ASAM Recommended Level of Treatment: ASAM Recommended Level of Treatment: Level II Intensive Outpatient Treatment   Substance use Disorder (SUD) Substance Use Disorder (SUD)  Checklist Symptoms of Substance Use: Continued use despite having a persistent/recurrent physical/psychological problem caused/exacerbated by use, Presence of craving or strong urge to use, Persistent desire or unsuccessful efforts to cut down or control use, Substance(s) often taken in larger amounts or over longer times than was intended  Recommendations for Services/Supports/Treatments: Recommendations for Services/Supports/Treatments Recommendations For Services/Supports/Treatments: SAIOP (Substance Abuse Intensive Outpatient Program), Inpatient Hospitalization  Discharge Disposition:    DSM5 Diagnoses: There are no problems to display for  this patient.    Referrals to Alternative Service(s): Referred to Alternative Service(s):   Place:   Date:   Time:    Referred to Alternative Service(s):   Place:   Date:   Time:    Referred to Alternative Service(s):   Place:   Date:   Time:    Referred to Alternative Service(s):   Place:   Date:   Time:     Waylan Boga, LCSW

## 2023-01-25 NOTE — ED Notes (Signed)
Pt escorted by [employer] and RN to room 28. Pt belongings were moved from triage to locker #28.

## 2023-01-25 NOTE — ED Provider Notes (Incomplete)
[place] EMERGENCY DEPARTMENT AT [facility] Provider Note   CSN: [id number] Arrival date & time: 01/25/23  2149     History {Add pertinent medical, surgical, social history, OB history to HPI:1} Chief Complaint  Patient presents with  . Suicidal    Anne Stewart is a 37 y.o. female.  Patient presents to the emergency department via law enforcement due to involuntary commitment.  Patient's parents filed IVC paperwork earlier today.  Patient's parents reported that the patient was post to go to an [place] but did not go.  She has reportedly been binge drinking since last Wednesday.  She admits to alcohol use today and when asked as to how much she drinks daily she states "too much."  She reportedly told her parents that she would like to slit her wrist several times tonight prior to the patient is taking out the IVC paperwork.  Upon my assessment the patient denies SI, HI, hallucinations.  Patient is tearful stating she wants to go home.  She denies any medical complaints at this time.  HPI     Home Medications Prior to Admission medications   Medication Sig Start Date End Date Taking? Authorizing Provider  ibuprofen (ADVIL) 600 MG tablet Take 1 tablet (600 mg total) by mouth every 6 (six) hours as needed. 10/10/19   Melynda Ripple, MD      Allergies    Patient has no known allergies.    Review of Systems   Review of Systems  Physical Exam Updated Vital Signs BP (!) 170/104 (BP Location: Right Arm)   Pulse (!) 106   Temp 98.4 F (36.9 C) (Oral)   Resp 20   LMP  (LMP Unknown)   SpO2 96%  Physical Exam HENT:     Head: Normocephalic and atraumatic.  Eyes:     Pupils: Pupils are equal, round, and reactive to light.  Pulmonary:     Effort: Pulmonary effort is normal. No respiratory distress.  Musculoskeletal:        General: No signs of injury.     Cervical back: Normal range of motion.  Skin:    General: Skin is dry.  Neurological:      Mental Status: She is alert.  Psychiatric:        Speech: Speech normal.        Behavior: Behavior normal.     ED Results / Procedures / Treatments   Labs (all labs ordered are listed, but only abnormal results are displayed) Labs Reviewed  COMPREHENSIVE METABOLIC PANEL  ETHANOL  SALICYLATE LEVEL  ACETAMINOPHEN LEVEL  CBC  RAPID URINE DRUG SCREEN, HOSP PERFORMED  I-STAT BETA HCG BLOOD, ED ([facility], [facility], [facility] ONLY)    EKG None  Radiology No results found.  Procedures Procedures  {Document cardiac monitor, telemetry assessment procedure when appropriate:1}  Medications Ordered in ED Medications  ondansetron (ZOFRAN) tablet 4 mg (has no administration in time range)  acetaminophen (TYLENOL) tablet 650 mg (has no administration in time range)    ED Course/ Medical Decision Making/ A&P   {   Click here for ABCD2, HEART and other calculatorsREFRESH Note before signing :1}                          Medical Decision Making Amount and/or Complexity of Data Reviewed Labs: ordered.  Risk OTC drugs. Prescription drug management.   Patient presents with a chief complaint of needing medical clearance due to IVC.  Medical clearance labs have been ordered.  Patient denies SI, HI, hallucinations at this time.  {Document critical care time when appropriate:1} {Document review of labs and clinical decision tools ie heart score, Chads2Vasc2 etc:1}  {Document your independent review of radiology images, and any outside records:1} {Document your discussion with family members, caretakers, and with consultants:1} {Document social determinants of health affecting pt's care:1} {Document your decision making why or why not admission, treatments were needed:1} Final Clinical Impression(s) / ED Diagnoses Final diagnoses:  None    Rx / DC Orders ED Discharge Orders     None

## 2023-01-25 NOTE — ED Notes (Addendum)
Pt's parents (IVC petitioners) arrived and requested to see pt.  This Probation officer spoke with parents in lobby and advised them of visitation policy and that they could return tomorrow morning to see pt. I also advised parents that the providers may wish to speak with them for more information and requested they leave their contact info, which is as follows:  Olam Idler (mother) [phone]  [name] (father) [phone]  Parents advise that they can be contacted anytime, no matter time of day/night.

## 2023-01-25 NOTE — ED Notes (Signed)
Pt resistant to dressing in our burgundy scrubs but is able to eventually be convinced to do so willingly. Pt has one pt belonging bag containing her clothes and jewelry located at triage nurses station. Pt states that one of the 2 bracelets is 39yrs old. Both bracelets are placed in specimen cup and labeled inside pt belonging bag. Security called to wand pt.

## 2023-01-26 ENCOUNTER — Encounter (HOSPITAL_COMMUNITY): Payer: Self-pay | Admitting: Registered Nurse

## 2023-01-26 ENCOUNTER — Inpatient Hospital Stay (HOSPITAL_COMMUNITY)
Admission: AD | Admit: 2023-01-26 | Discharge: 2023-01-30 | DRG: 897 | Disposition: A | Discharge status: Home / Self Care | Payer: BC Managed Care – PPO | Source: Intra-hospital | Attending: Psychiatry | Admitting: Psychiatry

## 2023-01-26 DIAGNOSIS — Z1152 Encounter for screening for COVID-19: Secondary | ICD-10-CM

## 2023-01-26 DIAGNOSIS — F1024 Alcohol dependence with alcohol-induced mood disorder: Secondary | ICD-10-CM | POA: Diagnosis present

## 2023-01-26 DIAGNOSIS — R45851 Suicidal ideations: Secondary | ICD-10-CM | POA: Diagnosis present

## 2023-01-26 DIAGNOSIS — Z91148 Patient's other noncompliance with medication regimen for other reason: Secondary | ICD-10-CM | POA: Diagnosis not present

## 2023-01-26 DIAGNOSIS — Z91199 Patient's noncompliance with other medical treatment and regimen due to unspecified reason: Secondary | ICD-10-CM

## 2023-01-26 DIAGNOSIS — Z79899 Other long term (current) drug therapy: Secondary | ICD-10-CM

## 2023-01-26 DIAGNOSIS — Z818 Family history of other mental and behavioral disorders: Secondary | ICD-10-CM

## 2023-01-26 DIAGNOSIS — F411 Generalized anxiety disorder: Secondary | ICD-10-CM | POA: Diagnosis present

## 2023-01-26 DIAGNOSIS — F10229 Alcohol dependence with intoxication, unspecified: Secondary | ICD-10-CM | POA: Diagnosis present

## 2023-01-26 DIAGNOSIS — F1020 Alcohol dependence, uncomplicated: Secondary | ICD-10-CM | POA: Diagnosis present

## 2023-01-26 DIAGNOSIS — F1094 Alcohol use, unspecified with alcohol-induced mood disorder: Principal | ICD-10-CM | POA: Diagnosis present

## 2023-01-26 DIAGNOSIS — Y908 Blood alcohol level of 240 mg/100 ml or more: Secondary | ICD-10-CM | POA: Diagnosis present

## 2023-01-26 DIAGNOSIS — F32A Depression, unspecified: Secondary | ICD-10-CM | POA: Diagnosis present

## 2023-01-26 DIAGNOSIS — Z046 Encounter for general psychiatric examination, requested by authority: Secondary | ICD-10-CM

## 2023-01-26 LAB — RAPID URINE DRUG SCREEN, HOSP PERFORMED
Amphetamines: NOT DETECTED
Barbiturates: NOT DETECTED
Benzodiazepines: NOT DETECTED
Cocaine: NOT DETECTED
Opiates: NOT DETECTED
Tetrahydrocannabinol: POSITIVE — AB

## 2023-01-26 LAB — SARS CORONAVIRUS 2 BY RT PCR: SARS Coronavirus 2 by RT PCR: NEGATIVE

## 2023-01-26 LAB — ETHANOL: Alcohol, Ethyl (B): 288 mg/dL — ABNORMAL HIGH (ref <10)

## 2023-01-26 MED ORDER — GABAPENTIN 300 MG PO CAPS: 300.0000 mg | ORAL_CAPSULE | Freq: Four times a day (QID) | ORAL | Status: DC

## 2023-01-26 MED ORDER — TRAZODONE HCL 50 MG PO TABS: 50.0000 mg | ORAL_TABLET | Freq: Every evening | ORAL | Status: DC | PRN

## 2023-01-26 MED ORDER — CHLORDIAZEPOXIDE HCL 25 MG PO CAPS: 25.0000 mg | ORAL_CAPSULE | ORAL | Status: DC

## 2023-01-26 MED ORDER — GABAPENTIN 300 MG PO CAPS
300.0000 mg | ORAL_CAPSULE | Freq: Three times a day (TID) | ORAL | Status: DC
  Administered 2023-01-26 (×2): 300 mg via ORAL
  Filled 2023-01-26 (×2): qty 1

## 2023-01-26 MED ORDER — CHLORDIAZEPOXIDE HCL 25 MG PO CAPS: 25.0000 mg | ORAL_CAPSULE | Freq: Three times a day (TID) | ORAL | Status: DC

## 2023-01-26 MED ORDER — CHLORDIAZEPOXIDE HCL 25 MG PO CAPS: 25.0000 mg | ORAL_CAPSULE | Freq: Every day | ORAL | Status: DC

## 2023-01-26 MED ORDER — ONDANSETRON 4 MG PO TBDP: 4.0000 mg | ORAL_TABLET | Freq: Four times a day (QID) | ORAL | Status: DC | PRN

## 2023-01-26 MED ORDER — CHLORDIAZEPOXIDE HCL 25 MG PO CAPS: 25.0000 mg | ORAL_CAPSULE | Freq: Four times a day (QID) | ORAL | Status: DC | PRN

## 2023-01-26 MED ORDER — HYDROXYZINE HCL 25 MG PO TABS
25.0000 mg | ORAL_TABLET | Freq: Four times a day (QID) | ORAL | Status: DC | PRN
  Administered 2023-01-26: 25 mg via ORAL
  Filled 2023-01-26: qty 1

## 2023-01-26 MED ORDER — TRAZODONE HCL 50 MG PO TABS: 50.0000 mg | ORAL_TABLET | Freq: Every day | ORAL | Status: DC

## 2023-01-26 MED ORDER — CHLORDIAZEPOXIDE HCL 25 MG PO CAPS
25.0000 mg | ORAL_CAPSULE | Freq: Four times a day (QID) | ORAL | Status: DC
  Administered 2023-01-26 (×3): 25 mg via ORAL
  Filled 2023-01-26 (×3): qty 1

## 2023-01-26 MED ORDER — LOPERAMIDE HCL 2 MG PO CAPS: 2.0000 mg | ORAL_CAPSULE | ORAL | Status: DC | PRN

## 2023-01-26 MED ORDER — THIAMINE HCL 100 MG/ML IJ SOLN
100.0000 mg | Freq: Once | INTRAMUSCULAR | Status: AC
  Administered 2023-01-26: 100 mg via INTRAMUSCULAR
  Filled 2023-01-26: qty 2

## 2023-01-26 MED ORDER — ADULT MULTIVITAMIN W/MINERALS CH
1.0000 | ORAL_TABLET | Freq: Every day | ORAL | Status: DC
  Administered 2023-01-26: 1 via ORAL
  Filled 2023-01-26: qty 1

## 2023-01-26 NOTE — ED Notes (Signed)
Pts visitor has left after talking to [facility]

## 2023-01-26 NOTE — ED Notes (Signed)
This Probation officer spoke with patients ex-husband who stated that patient has a terrible alcohol problem that she has been struggling with for over 2 years. He states that patient wants to change but mentally has not been able to do it on her own. He also states that patient is often found blacked out when he goes to check on her and feels as though she is a threat to herself and should not be left without supervision. He states they have 3 children together who are currently in [place] with his grandparents for spring break. He states patient does drink daily and does not believe she is capable of taking care of there children at this time.

## 2023-01-26 NOTE — ED Notes (Signed)
Psych NP in room

## 2023-01-26 NOTE — ED Notes (Signed)
Pt's visitor in room

## 2023-01-26 NOTE — ED Notes (Signed)
A hat was placed in the toilet for pt while she was sleep. Pt has not voided since arriving to TCU. Pt has had several cups of water on top of the alcohol she had consumed. Pt still denied having to use the bathroom when asked while getting vitals.

## 2023-01-26 NOTE — Consult Note (Signed)
[facility] ED ASSESSMENT   Reason for Consult:  IVC, suicidal ideation, alcohol use disorder Referring Physician:  Dorothyann Peng, PA-C  Patient Identification: Anne Stewart MRN:  [id number] ED Chief Complaint: Alcohol-induced mood disorder with depressive symptoms ([place])  Diagnosis:  Principal Problem:   Alcohol-induced mood disorder with depressive symptoms ([place]) Active Problems:   Involuntary commitment   Alcohol use disorder, severe, dependence ([place])   Suicidal ideation   ED Assessment Time Calculation: Start Time: 1100 Stop Time: 1145 Total Time in Minutes (Assessment Completion): 45  HPI: Anne Stewart is a 37 y.o. female patient with history of alcohol use disorder and was admitted to [facility] ED after presenting via [place] under IVC petitioned by her step father [name] [phone].  Per IVC: "Respondent has been diagnosed with alcoholism.  Respondent has been to rehab in the past and was unsuccessful with staying sober.  Respondent gets drunk while watching her children and become aggressive with family when they come to take the kids for their safety.  Respondent refuses to get help and her solution is to commit self-harm.  Respondent admitted today that she was going to slit her wrist.  Without intervention the respondent could harm herself of others."     Nolon Stalls seen face to face by this provider, consulted with Dr. Hampton Abbot; and chart reviewed on 01/26/23.  On evaluation Virlee Bellow asked what happen to get her in hospital and she states "I did not threaten to hurt myself.  I was drunk but I did not say I was gonna hurt myself or anyone else.  Patient denies suicidal/self-harm/homicidal ideation, psychosis, paranoia.  Patient reports no prior history of self-harm or suicide attempt.  Patient states she has never had psychiatric hospitalization.  Reports she currently has outpatient psychiatric services with [facility] where she is participating  in a chemical dependence intensive outpatient program for the last 2 weeks.  Patient also reports she is prescribed gabapentin and trazodone..  Throughout assessment patient repeatedly states "I never said anything about hurting myself or anybody else."  Patient reports that she does drink alcohol but usually drinks when she is home alone.  Patient reports she did talk to her stepfather while she was in intoxicated but never said anything about hurting herself.  Patient reports "He asked me if I ever thought about hurting myself and I told him yeah I thought about it but I would never do it.  That they were just thoughts.  Just because the person has thoughts about it don't justify being handcuffed and drink to a psychiatric ward."  Patient reports that her children are not home currently that they are in [place] with her paternal grandparents during spring break.  Reports her children have been gone since Friday 01/22/23.  Patient reports that she usually drinks when she is alone.  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  When asked what happened to her face she stated "a fall" but no further elaboration patient gave permission to speak to her ex-husband for collateral information Catha Gosselin [phone]. During evaluation Maycel Vanscoyk is sitting in a chir with right foot propped in seat and head resting on knee.  There was no noted distress.  She is alert/oriented x 4, calm, cooperative, attentive, and responses were relevant and appropriate to assessment questions but liner in responses.  Patient appeared to be guarded and most answers were short or not completed.  She spoke in a clear tone at moderate volume, and normal pace, with fair eye contact.   She denies suicidal/self-harm/homicidal ideation, psychosis, and paranoia.  Objectively:  there is no evidence of psychosis/mania or delusional thinking.  She conversed coherently, with goal  directed thoughts, and no distractibility, or pre-occupation.  Past Psychiatric History: Depression, anxiety, alcohol use disorder.    Risk to Self or Others: Is the patient at risk to self? Yes Has the patient been a risk to self in the past 6 months? No Has the patient been a risk to self within the distant past? No Is the patient a risk to others? Yes  Reported home with children while intoxicated and blacked out.  Will consult social work for CPS referral.  Has the patient been a risk to others in the past 6 months? No Has the patient been a risk to others within the distant past? No  [place] Scale:  [facility] ED from 01/25/2023 in [facility] Emergency Department at [place] Moderate Risk       AIMS:  , , ,  ,   ASAM: ASAM Multidimensional Assessment Summary Dimension 1:  Description of individual's past and current experiences of substance use and withdrawal: Pt reprots shaking, sweats and throwing up. DImension 1:  Acute Intoxication and/or Withdrawal Potential Severity Rating: None Dimension 2:  Description of patient's biomedical conditions and  complications: Pt reports no physical pain or discomfort at this time. Dimension 2:  Biomedical Conditions and Complications Severity Rating: None Dimension 3:  Description of emotional, behavioral, or cognitive conditions and complications: Pt expressed SI Dimension 3:  Emotional, behavioral or cognitive (EBC) conditions and complications severity rating: MIld Dimension 4:  Description of Readiness to Change criteria: Pt expresses no desire to seek treatment at this time Dimension 4:  Readiness to Change Severity Rating: Moderate Dimension 5:  Relapse, continued use, or continued problem potential critiera description: Pt minimizes there is a poblem Dimension 5:  Relapse, continued use, or continued problem potential severity rating: Severe Dimension 6:  Recovery/Iiving environment criteria  description: Pt has support of her mother and father. Dimension 6:  Recovery/living environment severity rating: None ASAM's Severity Rating Score: 6 ASAM Recommended Level of Treatment: Level II Intensive Outpatient Treatment  Substance Abuse:  Alcohol / Drug Use Pain Medications: See MAR Prescriptions: See MAR Over the Counter: See MAR History of alcohol / drug use?: Yes Longest period of sobriety (when/how long): 18 days Negative Consequences of Use: Personal relationships Withdrawal Symptoms: Tremors, Sweats, Nausea / Vomiting  Past Medical History: History reviewed. No pertinent past medical history. History reviewed. No pertinent surgical history. Family History: History reviewed. No pertinent family history. Family Psychiatric  History: None reported Social History:  Social History   Substance and Sexual Activity  Alcohol Use Yes     Social History   Substance and Sexual Activity  Drug Use Not Currently    Social History   Socioeconomic History   Marital status: Single    Spouse name: Not on file   Number of children: Not on file   Years of education: Not on file   Highest education level: Not on file  Occupational History   Not on file  Tobacco Use   Smoking status: Never   Smokeless tobacco: Not on file  Substance and Sexual Activity   Alcohol use: Yes   Drug use: Not Currently   Sexual activity: Not on file  Other Topics Concern  Not on file  Social History Narrative   Not on file   Social Determinants of Health   Financial Resource Strain: Not on file  Food Insecurity: Not on file  Transportation Needs: Not on file  Physical Activity: Not on file  Stress: Not on file  Social Connections: Not on file   Additional Social History:    Allergies:  No Known Allergies  Labs:  Results for orders placed or performed during the hospital encounter of 01/25/23 (from the past 48 hour(s))  Comprehensive metabolic panel     Status: Abnormal   Collection  Time: 01/25/23 11:00 PM  Result Value Ref Range   Sodium 142 135 - 145 mmol/L   Potassium 3.9 3.5 - 5.1 mmol/L   Chloride 104 98 - 111 mmol/L   CO2 20 (L) 22 - 32 mmol/L   Glucose, Bld 105 (H) 70 - 99 mg/dL    Comment: Glucose reference range applies only to samples taken after fasting for at least 8 hours.   BUN 7 6 - 20 mg/dL   Creatinine, Ser 0.61 0.44 - 1.00 mg/dL   Calcium 8.8 (L) 8.9 - 10.3 mg/dL   Total Protein 8.3 (H) 6.5 - 8.1 g/dL   Albumin 4.6 3.5 - 5.0 g/dL   AST 38 15 - 41 U/L   ALT 36 0 - 44 U/L   Alkaline Phosphatase 111 38 - 126 U/L   Total Bilirubin 0.5 0.3 - 1.2 mg/dL   GFR, Estimated >60 >60 mL/min    Comment: (NOTE) Calculated using the CKD-EPI Creatinine Equation (2021)    Anion gap 18 (H) 5 - 15    Comment: Performed at [facility], [place] [address], [place], [place] [address]  Ethanol     Status: Abnormal   Collection Time: 01/25/23 11:00 PM  Result Value Ref Range   Alcohol, Ethyl (B) 413 (HH) <10 mg/dL    Comment: CRITICAL RESULT CALLED TO, READ BACK BY AND VERIFIED WITH A. JASPER, RN (NOTE) Lowest detectable limit for serum alcohol is 10 mg/dL.  For medical purposes only. Performed at [facility], [place] [address], [place], [place] [id number]   Salicylate level     Status: Abnormal   Collection Time: 01/25/23 11:00 PM  Result Value Ref Range   Salicylate Lvl [id number] (L) 7.0 - 30.0 mg/dL    Comment: Performed at [facility], [place] [address] [place], [place] [address]  Acetaminophen level     Status: Abnormal   Collection Time: 01/25/23 11:00 PM  Result Value Ref Range   Acetaminophen (Tylenol), Serum <10 (L) 10 - 30 ug/mL    Comment: (NOTE) Therapeutic concentrations vary significantly. A range of 10-30 ug/mL  may be an effective concentration for many patients. However, some  are best treated at concentrations outside of this range. Acetaminophen concentrations >150 ug/mL at 4 hours after  ingestion  and >50 ug/mL at 12 hours after ingestion are often associated with  toxic reactions.  Performed at [facility], [place] [address] [place], [place] [address]   cbc     Status: None   Collection Time: 01/25/23 11:00 PM  Result Value Ref Range   WBC 7.6 4.0 - 10.5 K/uL   RBC 4.44 3.87 - 5.11 MIL/uL   Hemoglobin 14.0 12.0 - 15.0 g/dL   HCT 40.8 36.0 - 46.0 %   MCV 91.9 80.0 - 100.0 fL   MCH 31.5 26.0 - 34.0 pg   MCHC 34.3  30.0 - 36.0 g/dL   RDW 13.9 11.5 - 15.5 %   Platelets 306 150 - 400 K/uL   nRBC 0.0 0.0 - 0.2 %    Comment: Performed at [facility], [place] [address], [place], [place] [address]  I-Stat beta hCG blood, ED     Status: None   Collection Time: 01/25/23 11:09 PM  Result Value Ref Range   I-stat hCG, quantitative <5.0 <5 mIU/mL   Comment 3            Comment:   GEST. AGE      CONC.  (mIU/mL)   <=1 WEEK        5 - 50     2 WEEKS       50 - 500     3 WEEKS       100 - 10,000     4 WEEKS     1,000 - 30,000        FEMALE AND NON-PREGNANT FEMALE:     LESS THAN 5 mIU/mL   Ethanol     Status: Abnormal   Collection Time: 01/26/23  3:35 AM  Result Value Ref Range   Alcohol, Ethyl (B) 288 (H) <10 mg/dL    Comment: (NOTE) Lowest detectable limit for serum alcohol is 10 mg/dL.  For medical purposes only. Performed at [facility], [place] [address] [place], [place] [address]   Rapid urine drug screen (hospital performed)     Status: Abnormal   Collection Time: 01/26/23  6:32 AM  Result Value Ref Range   Opiates NONE DETECTED NONE DETECTED   Cocaine NONE DETECTED NONE DETECTED   Benzodiazepines NONE DETECTED NONE DETECTED   Amphetamines NONE DETECTED NONE DETECTED   Tetrahydrocannabinol POSITIVE (A) NONE DETECTED   Barbiturates NONE DETECTED NONE DETECTED    Comment: (NOTE) DRUG SCREEN FOR MEDICAL PURPOSES ONLY.  IF CONFIRMATION IS NEEDED FOR ANY PURPOSE, NOTIFY LAB WITHIN 5 DAYS.  LOWEST  DETECTABLE LIMITS FOR URINE DRUG SCREEN Drug Class                     Cutoff (ng/mL) Amphetamine and metabolites    1000 Barbiturate and metabolites    200 Benzodiazepine                 200 Opiates and metabolites        300 Cocaine and metabolites        300 THC                            50 Performed at [facility], [place] [address] [place],  [address]   SARS Coronavirus 2 by RT PCR (hospital order, performed in [facility] hospital lab) *cepheid single result test* Anterior Nasal Swab     Status: None   Collection Time: 01/26/23  9:28 AM   Specimen: Anterior Nasal Swab  Result Value Ref Range   SARS Coronavirus 2 by RT PCR NEGATIVE NEGATIVE    Comment: (NOTE) SARS-CoV-2 target nucleic acids are NOT DETECTED.  The SARS-CoV-2 RNA is generally detectable in upper and lower respiratory specimens during the acute phase of infection. The lowest concentration of SARS-CoV-2 viral copies this assay can detect is 250 copies / mL. A negative result does not preclude SARS-CoV-2 infection and should not be used as the sole basis for treatment or other patient management decisions.  A negative result may  occur with improper specimen collection / handling, submission of specimen other than nasopharyngeal swab, presence of viral mutation(s) within the areas targeted by this assay, and inadequate number of viral copies (<250 copies / mL). A negative result must be combined with clinical observations, patient history, and epidemiological information.  Fact Sheet for Patients:   https://www.patel.info/  Fact Sheet for Healthcare Providers: https://hall.com/  This test is not yet approved or  cleared by the [place] FDA and has been authorized for detection and/or diagnosis of SARS-CoV-2 by FDA under an Emergency Use Authorization (EUA).  This EUA will remain in effect (meaning this test can be used) for the duration  of the COVID-19 declaration under Section 564(b)(1) of the Act, 21 U.S.C. section 360bbb-3(b)(1), unless the authorization is terminated or revoked sooner.  Performed at [facility], [place] [address], [place], [place] [address]     Current Facility-Administered Medications  Medication Dose Route Frequency Provider Last Rate Last Admin   acetaminophen (TYLENOL) tablet 650 mg  650 mg Oral Q4H PRN Dorothyann Peng, PA-C       ondansetron [facility]) tablet 4 mg  4 mg Oral Q8H PRN Dorothyann Peng, PA-C       Current Outpatient Medications  Medication Sig Dispense Refill   Armodafinil 250 MG tablet Take 250 mg by mouth daily.     gabapentin (NEURONTIN) 300 MG capsule Take 300 mg by mouth 4 (four) times daily.     ibuprofen (ADVIL) 600 MG tablet Take 1 tablet (600 mg total) by mouth every 6 (six) hours as needed. 30 tablet 0   naltrexone (DEPADE) 50 MG tablet Take 50 mg by mouth daily.     propranolol (INDERAL) 10 MG tablet Take 10 mg by mouth 4 (four) times daily.     thiamine (VITAMIN B1) 100 MG tablet Take 100 mg by mouth every morning.     traZODone (DESYREL) 50 MG tablet Take 50-100 mg by mouth at bedtime.      Musculoskeletal: Strength & Muscle Tone: within normal limits Gait & Station: normal Patient leans: N/A   Psychiatric Specialty Exam: Presentation  General Appearance: Appropriate for Environment  Eye Contact:Fair  Speech:Clear and Coherent; Normal Rate  Speech Volume:Normal  Handedness:Right   Mood and Affect  Mood:Anxious; Dysphoric  Affect:Tearful; Depressed   Thought Process  Thought Processes:Coherent; Goal Directed; Linear  Descriptions of Associations:Intact  Orientation:Full (Time, Place and Person)  Thought Content:Logical  History of Schizophrenia/Schizoaffective disorder:No  Duration of Psychotic Symptoms:No data recorded Hallucinations:Hallucinations: None  Ideas of Reference:None  Suicidal Thoughts:Suicidal  Thoughts: No (Patient denies suicidal.  IVC with report of suicidal statement)  Homicidal Thoughts:Homicidal Thoughts: No   Sensorium  Memory:Immediate Good; Recent Good; Remote Good  Judgment:Fair  Insight:Present   Executive Functions  Concentration:Good  Attention Span:Good  [place] of Knowledge:Good  Language:Good   Psychomotor Activity  Psychomotor Activity:Psychomotor Activity: Normal   Assets  Assets:Communication Skills; Desire for Improvement; Financial Resources/Insurance; Physical Health; Social Support; Transport planner; Vocational/Educational  Sleep  Sleep:Sleep: Good  Physical Exam: Physical Exam Vitals and nursing note reviewed. Exam conducted with a chaperone present.  Constitutional:      General: She is not in acute distress.    Appearance: Normal appearance. She is not ill-appearing.  HENT:     Head: Normocephalic.  Eyes:     Conjunctiva/sclera: Conjunctivae normal.  Cardiovascular:     Rate and Rhythm: Normal rate.  Pulmonary:     Effort: Pulmonary effort is normal. No  respiratory distress.  Skin:    General: Skin is warm and dry.  Neurological:     Mental Status: She is alert and oriented to person, place, and time.  Psychiatric:        Attention and Perception: Attention and perception normal. She does not perceive auditory or visual hallucinations.        Mood and Affect: Mood is anxious and depressed. Affect is tearful.        Speech: Speech normal.        Behavior: Behavior normal. Behavior is cooperative.        Thought Content: Thought content is not paranoid. Thought content does not include homicidal ideation. Suicidal: Patient denies but IVC reports making suicidal statements.       Cognition and Memory: Cognition normal.        Judgment: Judgment is impulsive.    Review of Systems  Psychiatric/Behavioral:  Positive for depression and suicidal ideas (THC). Negative for hallucinations. The patient is nervous/anxious  and has insomnia.   All other systems reviewed and are negative.  Blood pressure 125/79, pulse 83, temperature 99 F (37.2 C), temperature source Oral, resp. rate 16, SpO2 96 %. There is no height or weight on file to calculate BMI.  Medical Decision Making: Patient meets criteria for inpatient psychiatry.  Involuntary commitment that patient a danger to herself and other.    Problem 1: Medication management: Restarted Home medications Gabapentin 300 mg Tid and Trazodone 300 mg  Started Librium detox protocol.  Problem 2: Attempted to file CPS report but informed since children are currently out of town a report couldn't be filed.    Problem 3: Labs reviewed:  UDS positive for THC, ETOH at 01/25/23 at 2300 was 413 repeated 01/26/23 at 0335 it had decreased to 288 EKG ordered:  No prior EKG on record  Disposition: Recommend psychiatric Inpatient admission when medically cleared.  Arlander Gillen, NP 01/26/2023 12:22 PM

## 2023-01-26 NOTE — ED Notes (Signed)
Pt jumped up and went to the bathroom. This NA caught her before attempting to void because a urine sample is needed. Pt stated that she did not have to void. Pt then pulled a bloody pad out of her pants and when asked did she need something else since she was obviously on her menstrual, pt said no and shoved a bunch of paper towel in her pants. Nurse notified.

## 2023-01-26 NOTE — ED Notes (Signed)
Pt visitor in room.

## 2023-01-26 NOTE — ED Notes (Signed)
Telepsych in process 

## 2023-01-26 NOTE — ED Notes (Signed)
Pt awake in room. Did not want to eat breakfast. Pt asking for phone, educated about phone rules in the [facility] unit. Pt changed fully into purple scrubs. Green tank top placed in locker 38 with her belongings. Pt tearful stating she doesn't know why she's here, she is not suicidal. Emotional support given.

## 2023-01-26 NOTE — Progress Notes (Signed)
Pt was accepted to [facility] 01/26/2023, pending IVC paperwork faxed to [phone]. Bed assignment: 404-1  Pt meets inpatient criteria per Earleen Newport, NP  Attending Physician will be Janine Limbo, MD  Report can be called to: - Adult unit: [phone]  Pt can arrive after 2100  Care Team Notified: [facility] Lynnda Shields, RN, Claire Shown, RN, Sibyl Parr, RN, Earleen Newport, NP, and [facility], NT  [place], [place]  01/26/2023 11:54 AM

## 2023-01-26 NOTE — ED Notes (Signed)
Pt. showering

## 2023-01-26 NOTE — ED Notes (Signed)
IVC faxed

## 2023-01-26 NOTE — ED Provider Notes (Signed)
Patient's care assumed at 6:30 AM from [facility].  Patient has a history of alcohol abuse.  Patient's blood alcohol level was initially 413.  Patient has IVC in place.  Behavioral health has advised they will patient when her alcohol level is decreased .  Patient reevaluated at 1115, patient is awake and alert.  Patient is not showing any signs of withdrawal.   And is clear for psychiatric evaluation at this time   Sidney Ace 01/26/23 1119    Hayden Rasmussen, MD 01/26/23 [phone]

## 2023-01-26 NOTE — ED Notes (Signed)
Pt given toothbrush, toothpaste, body and skin care, mesh underwear with pads. Pt asking this RN "what can I do to get out of here" explained to patient the IVC process and that we are keeping her safe from harm

## 2023-01-27 ENCOUNTER — Other Ambulatory Visit: Payer: Self-pay

## 2023-01-27 ENCOUNTER — Encounter (HOSPITAL_COMMUNITY): Payer: Self-pay | Admitting: Registered Nurse

## 2023-01-27 ENCOUNTER — Encounter (HOSPITAL_COMMUNITY): Payer: Self-pay

## 2023-01-27 DIAGNOSIS — F1094 Alcohol use, unspecified with alcohol-induced mood disorder: Secondary | ICD-10-CM | POA: Diagnosis not present

## 2023-01-27 MED ORDER — CHLORDIAZEPOXIDE HCL 25 MG PO CAPS: 25.0000 mg | ORAL_CAPSULE | Freq: Four times a day (QID) | ORAL | Status: AC | PRN

## 2023-01-27 MED ORDER — BUSPIRONE HCL 5 MG PO TABS
5.0000 mg | ORAL_TABLET | Freq: Three times a day (TID) | ORAL | Status: DC
  Filled 2023-01-27 (×2): qty 1

## 2023-01-27 MED ORDER — ALUM & MAG HYDROXIDE-SIMETH 200-200-20 MG/5ML PO SUSP: 30.0000 mL | ORAL | Status: DC | PRN

## 2023-01-27 MED ORDER — ONDANSETRON 4 MG PO TBDP: 4.0000 mg | ORAL_TABLET | Freq: Four times a day (QID) | ORAL | Status: AC | PRN

## 2023-01-27 MED ORDER — CLONIDINE HCL 0.1 MG PO TABS
0.1000 mg | ORAL_TABLET | Freq: Once | ORAL | Status: AC
  Administered 2023-01-27: 0.1 mg via ORAL
  Filled 2023-01-27 (×2): qty 1

## 2023-01-27 MED ORDER — LOPERAMIDE HCL 2 MG PO CAPS: 2.0000 mg | ORAL_CAPSULE | ORAL | Status: AC | PRN

## 2023-01-27 MED ORDER — ARMODAFINIL 250 MG PO TABS: 250.0000 mg | ORAL_TABLET | Freq: Every day | ORAL | Status: DC

## 2023-01-27 MED ORDER — GABAPENTIN 300 MG PO CAPS
300.0000 mg | ORAL_CAPSULE | Freq: Three times a day (TID) | ORAL | Status: DC
  Administered 2023-01-27 (×2): 300 mg via ORAL
  Filled 2023-01-27 (×7): qty 1

## 2023-01-27 MED ORDER — PROPRANOLOL HCL 10 MG PO TABS
10.0000 mg | ORAL_TABLET | Freq: Three times a day (TID) | ORAL | Status: DC
  Administered 2023-01-27 – 2023-01-30 (×9): 10 mg via ORAL
  Filled 2023-01-27 (×15): qty 1

## 2023-01-27 MED ORDER — HYDROXYZINE HCL 25 MG PO TABS
25.0000 mg | ORAL_TABLET | Freq: Four times a day (QID) | ORAL | Status: AC | PRN
  Administered 2023-01-27: 25 mg via ORAL
  Filled 2023-01-27: qty 1

## 2023-01-27 MED ORDER — MAGNESIUM HYDROXIDE 400 MG/5ML PO SUSP: 30.0000 mL | Freq: Every day | ORAL | Status: DC | PRN

## 2023-01-27 MED ORDER — BUSPIRONE HCL 10 MG PO TABS
10.0000 mg | ORAL_TABLET | Freq: Three times a day (TID) | ORAL | Status: DC
  Administered 2023-01-27 – 2023-01-29 (×5): 10 mg via ORAL
  Filled 2023-01-27 (×3): qty 1
  Filled 2023-01-27: qty 2
  Filled 2023-01-27 (×11): qty 1

## 2023-01-27 MED ORDER — ACETAMINOPHEN 325 MG PO TABS: 650.0000 mg | ORAL_TABLET | ORAL | Status: DC | PRN

## 2023-01-27 MED ORDER — MODAFINIL 200 MG PO TABS
200.0000 mg | ORAL_TABLET | Freq: Every day | ORAL | Status: DC
  Administered 2023-01-27 – 2023-01-30 (×4): 200 mg via ORAL
  Filled 2023-01-27 (×4): qty 1

## 2023-01-27 MED ORDER — TRAZODONE HCL 50 MG PO TABS
50.0000 mg | ORAL_TABLET | Freq: Every evening | ORAL | Status: DC | PRN
  Administered 2023-01-28: 50 mg via ORAL
  Filled 2023-01-27: qty 1

## 2023-01-27 MED ORDER — CHLORDIAZEPOXIDE HCL 25 MG PO CAPS
25.0000 mg | ORAL_CAPSULE | ORAL | Status: AC
  Administered 2023-01-29 (×2): 25 mg via ORAL
  Filled 2023-01-27 (×2): qty 1

## 2023-01-27 MED ORDER — PROPRANOLOL HCL 10 MG PO TABS
10.0000 mg | ORAL_TABLET | Freq: Four times a day (QID) | ORAL | Status: DC
  Administered 2023-01-27 (×2): 10 mg via ORAL
  Filled 2023-01-27 (×9): qty 1

## 2023-01-27 MED ORDER — CHLORDIAZEPOXIDE HCL 25 MG PO CAPS
25.0000 mg | ORAL_CAPSULE | Freq: Every day | ORAL | Status: DC
  Filled 2023-01-27: qty 1

## 2023-01-27 MED ORDER — CLONIDINE HCL 0.1 MG PO TABS: 0.1000 mg | ORAL_TABLET | Freq: Two times a day (BID) | ORAL | Status: DC | PRN

## 2023-01-27 MED ORDER — ADULT MULTIVITAMIN W/MINERALS CH
1.0000 | ORAL_TABLET | Freq: Every day | ORAL | Status: DC
  Administered 2023-01-27 – 2023-01-30 (×4): 1 via ORAL
  Filled 2023-01-27 (×6): qty 1

## 2023-01-27 MED ORDER — CHLORDIAZEPOXIDE HCL 25 MG PO CAPS
25.0000 mg | ORAL_CAPSULE | Freq: Four times a day (QID) | ORAL | Status: AC
  Administered 2023-01-27 (×4): 25 mg via ORAL
  Filled 2023-01-27 (×4): qty 1

## 2023-01-27 MED ORDER — GABAPENTIN 300 MG PO CAPS
300.0000 mg | ORAL_CAPSULE | Freq: Three times a day (TID) | ORAL | Status: DC
  Administered 2023-01-27 – 2023-01-30 (×12): 300 mg via ORAL
  Filled 2023-01-27 (×20): qty 1

## 2023-01-27 MED ORDER — CHLORDIAZEPOXIDE HCL 25 MG PO CAPS
25.0000 mg | ORAL_CAPSULE | Freq: Three times a day (TID) | ORAL | Status: AC
  Administered 2023-01-28: 25 mg via ORAL
  Filled 2023-01-27: qty 1

## 2023-01-27 NOTE — Tx Team (Signed)
Initial Treatment Plan 01/27/2023 4:02 AM Nolon Stalls [id number]    PATIENT STRESSORS: Marital or family conflict   Medication change or noncompliance   Substance abuse     PATIENT STRENGTHS: Financial means  Physical Health  Supportive family/friends  Work skills    PATIENT IDENTIFIED PROBLEMS: Substance abuse  Depression  "Healthy coping skills"                 DISCHARGE CRITERIA:  Ability to meet basic life and health needs Adequate post-discharge living arrangements Improved stabilization in mood, thinking, and/or behavior Medical problems require only outpatient monitoring  PRELIMINARY DISCHARGE PLAN: Attend aftercare/continuing care group Attend PHP/IOP Attend 12-step recovery group Outpatient therapy Return to previous living arrangement Return to previous work or school arrangements  PATIENT/FAMILY INVOLVEMENT: This treatment plan has been presented to and reviewed with the patient, Anne Stewart, and/or family member.  The patient and family have been given the opportunity to ask questions and make suggestions.  Wolfgang Phoenix, RN 01/27/2023, 4:02 AM
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
## 2023-01-27 NOTE — H&P (Signed)
Psychiatric Admission Assessment Adult  Patient Identification: Anne Stewart  MRN:  [id number]  Date of Evaluation:  01/27/2023  Chief Complaint: Worsening suicidal ideations with plans to cut her wirst while intoxicated.  Principal Diagnosis: Alcohol-induced mood disorder with depressive symptoms (HCC)  Diagnosis:  Principal Problem:   Alcohol-induced mood disorder with depressive symptoms ([place])  History of Present Illness: This is the first psychiatric admission, evaluation/treatment in this [facility] for this 37 year old Caucasian female with hx of alcohol use disorder, chronic. Admitted to the [facility] from the [facility] with complaint of suicidal ideations with plan to slit her wrists. Patient was at the time intoxicated. Her BAL at the ED was 413 which later dropped down to 288. Her UDS was also positive for THC. Patient is admitted to the [facility] under IVC by the police. Chart review indicated that patient was receiving mental health services on an outpatient basis with Dr. [facility]. During this evaluation,  Anne Stewart reports,   "The police took me to the [facility] on Monday, 2 days ago. My mother called them. She told them that I was trying or thinking about hurting myself. That was not what I said. I do remember being asked by my step-father if I have ever thought about hurting myself, I replied yes. We were just having a conversation about why I was drinking too much alcohol. I have had problem with alcoholism. I drink too much to deal with my anxiety that I have had since my childhood. I drink two bottles of wine daily. I can go some days without drinking any alcohol. I have been drinking heavily for 15-18 years. I have been able to handle my drinking, but now, it is a bit of a problem. It is affecting my relationship withy my parents & my children's father. I have always had anxiety issues. The fears I have are also the reason I have for drinking too much. I have phobia for  driving/eventual death. I don't think that I'm as depressed as I'm anxious. I can be overly anxious. Dr. [facility] Care had me on Naltrexone, propranolol, gabapentin & Trazodone. They do help, but I don't always take them. I'm also not consistent with my outpatient substance abuse treatments/meetings. I know mental illness ran in my family. My mother took Lithium back in the day for depression. She is no longer taking medicines. I have never attempted suicide".  Objective: Although presents as a good historian, patient shows some signs that she is a bit scattered with her thoughts. Presents a bit uncoordinated. She currently denies any alcohol withdrawal symptoms. There are no tremors noted.  Associated Signs/Symptoms:  Depression Symptoms:  depressed mood, insomnia, feelings of worthlessness/guilt, hopelessness, suicidal thoughts with specific plan, anxiety,  (Hypo) Manic Symptoms:  Impulsivity,  Anxiety Symptoms:  Excessive Worry,  Psychotic Symptoms:   Patient currently denies any AVH, delusional thoughts or paranoia. She does not appear to be responding to any internal stimuli.  PTSD Symptoms: NA  Total Time spent with patient: 1 hour  Past Psychiatric History: Patient reports hx of generalized anxiety since childhood. Says she drinks to deal with her anxiety issues. Says she started drinking at age 37, has now drank from 37 to 16 years. Sees Dr. Toy Care on an outpatient basis. Currently on gabapentin 300 mg, Naltrexone 50 mg & Propranolol 10 mg.  Is the patient at risk to self? No.  Has the patient been a risk to self in the past 6 months? Yes.  Has the patient been a risk to self within the distant past? Yes.    Is the patient a risk to others? No.  Has the patient been a risk to others in the past 6 months? No.  Has the patient been a risk to others within the distant past? No.   [place] Scale:  [place] Admission (Current) from 01/26/2023 in [facility] ED from 01/25/2023 in [facility] [place] No Risk Moderate Risk      Prior Inpatient Therapy: No. If yes, describe: NA   Prior Outpatient Therapy: Yes.   If yes, describe: Dr. [facility] Care.   Alcohol Screening: 1. How often do you have a drink containing alcohol?: 4 or more times a week 2. How many drinks containing alcohol do you have on a typical day when you are drinking?: 5 or 6 3. How often do you have six or more drinks on one occasion?: Weekly AUDIT-C Score: 9 4. How often during the last year have you found that you were not able to stop drinking once you had started?: Monthly 5. How often during the last year have you failed to do what was normally expected from you because of drinking?: Never 6. How often during the last year have you needed a first drink in the morning to get yourself going after a heavy drinking session?: Less than monthly 7. How often during the last year have you had a feeling of guilt of remorse after drinking?: Monthly 8. How often during the last year have you been unable to remember what happened the night before because you had been drinking?: Weekly 9. Have you or someone else been injured as a result of your drinking?: No 10. Has a relative or friend or a doctor or another health worker been concerned about your drinking or suggested you cut down?: Yes, but not in the last year Alcohol Use Disorder Identification Test Final Score (AUDIT): 19 Alcohol Brief Interventions/Follow-up: Alcohol education/Brief advice  Substance Abuse History in the last 12 months:  Yes.    Consequences of Substance Abuse: Discussed with patient during this admission evaluation. Medical Consequences:  Liver damage, Possible death by overdose Legal Consequences:  Arrests, jail time, Loss of driving privilege. Family Consequences:  Family discord, divorce and or separation.  Previous Psychotropic Medications: Yes    Psychological Evaluations: No   Past Medical History: History reviewed. No pertinent past medical history. History reviewed. No pertinent surgical history.  Family History: History reviewed. No pertinent family history.  Family Psychiatric  History: Major depressive disorder: "My mother, she took Lithium. That was a long time ago. She is no longer on medications".  Tobacco Screening:  Social History   Tobacco Use  Smoking Status Never  Smokeless Tobacco Not on file    [place] Tobacco Counseling     Are you interested in Tobacco Cessation Medications?  No value filed. Counseled patient on smoking cessation:  No value filed. Reason Tobacco Screening Not Completed: No value filed.       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 Denies any hx of emotional, physical or sexual abuse. Started drinking alcohol at the of 12. Social History   Substance and Sexual Activity  Alcohol Use Yes     Social History   Substance and Sexual Activity  Drug Use Not Currently    Additional Social History:  Allergies:  No Known Allergies  Lab Results:  Results for orders placed or performed during the hospital encounter of 01/25/23 (from the past 48 hour(s))  Comprehensive metabolic panel     Status: Abnormal   Collection Time: 01/25/23 11:00 PM  Result Value Ref Range   Sodium 142 135 - 145 mmol/L   Potassium 3.9 3.5 - 5.1 mmol/L   Chloride 104 98 - 111 mmol/L   CO2 20 (L) 22 - 32 mmol/L   Glucose, Bld 105 (H) 70 - 99 mg/dL    Comment: Glucose reference range applies only to samples taken after fasting for at least 8 hours.   BUN 7 6 - 20 mg/dL   Creatinine, Ser 0.61 0.44 - 1.00 mg/dL   Calcium 8.8 (L) 8.9 - 10.3 mg/dL   Total Protein 8.3 (H) 6.5 - 8.1 g/dL   Albumin 4.6 3.5 - 5.0 g/dL   AST 38 15 - 41 U/L   ALT 36 0 - 44 U/L   Alkaline Phosphatase 111 38 - 126 U/L   Total Bilirubin 0.5 0.3 - 1.2 mg/dL   GFR,  Estimated >60 >60 mL/min    Comment: (NOTE) Calculated using the CKD-EPI Creatinine Equation (2021)    Anion gap 18 (H) 5 - 15    Comment: Performed at [facility], [place] [address] [place], [place] [address]  Ethanol     Status: Abnormal   Collection Time: 01/25/23 11:00 PM  Result Value Ref Range   Alcohol, Ethyl (B) 413 (HH) <10 mg/dL    Comment: CRITICAL RESULT CALLED TO, READ BACK BY AND VERIFIED WITH A. JASPER, RN (NOTE) Lowest detectable limit for serum alcohol is 10 mg/dL.  For medical purposes only. Performed at [facility], [place] [address] [place], [place] [id number]   Salicylate level     Status: Abnormal   Collection Time: 01/25/23 11:00 PM  Result Value Ref Range   Salicylate Lvl [id number] (L) 7.0 - 30.0 mg/dL    Comment: Performed at [facility], [place] [address] [place], [place] [address]  Acetaminophen level     Status: Abnormal   Collection Time: 01/25/23 11:00 PM  Result Value Ref Range   Acetaminophen (Tylenol), Serum <10 (L) 10 - 30 ug/mL    Comment: (NOTE) Therapeutic concentrations vary significantly. A range of 10-30 ug/mL  may be an effective concentration for many patients. However, some  are best treated at concentrations outside of this range. Acetaminophen concentrations >150 ug/mL at 4 hours after ingestion  and >50 ug/mL at 12 hours after ingestion are often associated with  toxic reactions.  Performed at [facility], [place] [address], [place], [place] [address]   cbc     Status: None   Collection Time: 01/25/23 11:00 PM  Result Value Ref Range   WBC 7.6 4.0 - 10.5 K/uL   RBC 4.44 3.87 - 5.11 MIL/uL   Hemoglobin 14.0 12.0 - 15.0 g/dL   HCT 40.8 36.0 - 46.0 %   MCV 91.9 80.0 - 100.0 fL   MCH 31.5 26.0 - 34.0 pg   MCHC 34.3 30.0 - 36.0 g/dL   RDW 13.9 11.5 - 15.5 %   Platelets 306 150 - 400 K/uL   nRBC 0.0 0.0 - 0.2 %    Comment: Performed at [facility] [place] [address], [place], [place] [address]  I-Stat beta hCG blood, ED     Status: None   Collection Time: 01/25/23  11:09 PM  Result Value Ref Range   I-stat hCG, quantitative <5.0 <5 mIU/mL   Comment 3            Comment:   GEST. AGE      CONC.  (mIU/mL)   <=1 WEEK        5 - 50     2 WEEKS       50 - 500     3 WEEKS       100 - 10,000     4 WEEKS     1,000 - 30,000        FEMALE AND NON-PREGNANT FEMALE:     LESS THAN 5 mIU/mL   Ethanol     Status: Abnormal   Collection Time: 01/26/23  3:35 AM  Result Value Ref Range   Alcohol, Ethyl (B) 288 (H) <10 mg/dL    Comment: (NOTE) Lowest detectable limit for serum alcohol is 10 mg/dL.  For medical purposes only. Performed at [facility], [place] [address], [place], [place] [address]   Rapid urine drug screen (hospital performed)     Status: Abnormal   Collection Time: 01/26/23  6:32 AM  Result Value Ref Range   Opiates NONE DETECTED NONE DETECTED   Cocaine NONE DETECTED NONE DETECTED   Benzodiazepines NONE DETECTED NONE DETECTED   Amphetamines NONE DETECTED NONE DETECTED   Tetrahydrocannabinol POSITIVE (A) NONE DETECTED   Barbiturates NONE DETECTED NONE DETECTED    Comment: (NOTE) DRUG SCREEN FOR MEDICAL PURPOSES ONLY.  IF CONFIRMATION IS NEEDED FOR ANY PURPOSE, NOTIFY LAB WITHIN 5 DAYS.  LOWEST DETECTABLE LIMITS FOR URINE DRUG SCREEN Drug Class                     Cutoff (ng/mL) Amphetamine and metabolites    1000 Barbiturate and metabolites    200 Benzodiazepine                 200 Opiates and metabolites        300 Cocaine and metabolites        300 THC                            50 Performed at [facility], [place] [address] [place], [place] [address]   SARS Coronavirus 2 by RT PCR (hospital order, performed in [facility] hospital lab) *cepheid single result test* Anterior Nasal Swab     Status: None   Collection Time: 01/26/23  9:28 AM   Specimen: Anterior Nasal Swab   Result Value Ref Range   SARS Coronavirus 2 by RT PCR NEGATIVE NEGATIVE    Comment: (NOTE) SARS-CoV-2 target nucleic acids are NOT DETECTED.  The SARS-CoV-2 RNA is generally detectable in upper and lower respiratory specimens during the acute phase of infection. The lowest concentration of SARS-CoV-2 viral copies this assay can detect is 250 copies / mL. A negative result does not preclude SARS-CoV-2 infection and should not be used as the sole basis for treatment or other patient management decisions.  A negative result may occur with improper specimen collection / handling, submission of specimen other than nasopharyngeal swab, presence of viral mutation(s) within the areas targeted by this assay, and inadequate number of viral copies (<250 copies / mL). A negative result must be combined with clinical observations, patient history, and epidemiological information.  Fact Sheet for Patients:   https://www.patel.info/  Fact Sheet for Healthcare  Providers: https://hall.com/  This test is not yet approved or  cleared by the [place] and has been authorized for detection and/or diagnosis of SARS-CoV-2 by FDA under an Emergency Use Authorization (EUA).  This EUA will remain in effect (meaning this test can be used) for the duration of the COVID-19 declaration under Section 564(b)(1) of the Act, 21 U.S.C. section 360bbb-3(b)(1), unless the authorization is terminated or revoked sooner.  Performed at [facility], [place] [address] [place], [place] [address]    Blood Alcohol level:  Lab Results  Component Value Date   ETH 288 (H) 01/26/2023   ETH 413 (HH) [id number]   Metabolic Disorder Labs:  No results found for: "HGBA1C", "MPG" No results found for: "PROLACTIN" No results found for: "CHOL", "TRIG", "HDL", "CHOLHDL", "VLDL", "LDLCALC"  Current Medications: Current Facility-Administered Medications   Medication Dose Route Frequency Provider Last Rate Last Admin   acetaminophen (TYLENOL) tablet 650 mg  650 mg Oral Q4H PRN Rankin, Shuvon B, NP       chlordiazePOXIDE (LIBRIUM) capsule 25 mg  25 mg Oral Q6H PRN Rankin, Shuvon B, NP       chlordiazePOXIDE (LIBRIUM) capsule 25 mg  25 mg Oral QID Rankin, Shuvon B, NP       Followed by   Derrill Memo ON 01/28/2023] chlordiazePOXIDE (LIBRIUM) capsule 25 mg  25 mg Oral TID Rankin, Shuvon B, NP       Followed by   Derrill Memo ON 01/29/2023] chlordiazePOXIDE (LIBRIUM) capsule 25 mg  25 mg Oral BH-qamhs Rankin, Shuvon B, NP       Followed by   Derrill Memo ON 01/30/2023] chlordiazePOXIDE (LIBRIUM) capsule 25 mg  25 mg Oral Daily Rankin, Shuvon B, NP       gabapentin (NEURONTIN) capsule 300 mg  300 mg Oral TID Rankin, Shuvon B, NP       hydrOXYzine (ATARAX) tablet 25 mg  25 mg Oral Q6H PRN Rankin, Shuvon B, NP       loperamide (IMODIUM) capsule 2-4 mg  2-4 mg Oral PRN Rankin, Shuvon B, NP       modafinil (PROVIGIL) tablet 200 mg  200 mg Oral Daily Massengill, Nathan, MD       multivitamin with minerals tablet 1 tablet  1 tablet Oral Daily Rankin, Shuvon B, NP       ondansetron (ZOFRAN-ODT) disintegrating tablet 4 mg  4 mg Oral Q6H PRN Rankin, Shuvon B, NP       propranolol (INDERAL) tablet 10 mg  10 mg Oral QID Rankin, Shuvon B, NP       traZODone (DESYREL) tablet 50 mg  50 mg Oral QHS PRN,MR X 1 Rankin, Shuvon B, NP       PTA Medications: Medications Prior to Admission  Medication Sig Dispense Refill Last Dose   Armodafinil 250 MG tablet Take 250 mg by mouth daily.      gabapentin (NEURONTIN) 300 MG capsule Take 300 mg by mouth 4 (four) times daily.      naltrexone (DEPADE) 50 MG tablet Take 50 mg by mouth daily.      propranolol (INDERAL) 10 MG tablet Take 10 mg by mouth 4 (four) times daily.      traZODone (DESYREL) 50 MG tablet Take 50-100 mg by mouth at bedtime.      Musculoskeletal: Strength & Muscle Tone: within normal limits Gait & Station: normal Patient  leans: N/A  Psychiatric Specialty Exam:  Presentation  General Appearance:  Appropriate for Environment  Eye Contact:  Fair  Speech: Clear and Coherent; Normal Rate  Speech Volume: Normal  Handedness: Right   Mood and Affect  Mood: Anxious; Dysphoric  Affect: Tearful; Depressed   Thought Process  Thought Processes: Coherent; Goal Directed; Linear  Duration of Psychotic Symptoms: Greater than two weeks.  Past Diagnosis of Schizophrenia or Psychoactive disorder: No  Descriptions of Associations:Intact  Orientation:Full (Time, Place and Person)  Thought Content:Logical  Hallucinations:Hallucinations: None  Ideas of Reference:None  Suicidal Thoughts:Suicidal Thoughts: No (Patient denies suicidal.  IVC with report of suicidal statement)  Homicidal Thoughts:Homicidal Thoughts: No  Sensorium  Memory: Immediate Good; Recent Good; Remote Good  Judgment: Fair  Insight: Present  Executive Functions  Concentration: Good  Attention Span: Good  Recall: Good  Fund of Knowledge: Good  Language: Good  Psychomotor Activity  Psychomotor Activity: Psychomotor Activity: Normal  Assets  Assets: Communication Skills; Desire for Improvement; Financial Resources/Insurance; Physical Health; Social Support; Transport planner; Vocational/Educational  Sleep  Sleep: Sleep: Good  Physical Exam: Physical Exam Vitals and nursing note reviewed.  HENT:     Head: Normocephalic.     Nose: Nose normal.     Mouth/Throat:     Pharynx: Oropharynx is clear.  Eyes:     Pupils: Pupils are equal, round, and reactive to light.  Cardiovascular:     Rate and Rhythm: Normal rate.  Pulmonary:     Effort: Pulmonary effort is normal.  Genitourinary:    Comments: Deferred Musculoskeletal:        General: Normal range of motion.     Cervical back: Normal range of motion.  Skin:    General: Skin is warm and dry.  Neurological:     General: No focal deficit present.      Mental Status: She is alert and oriented to person, place, and time.    Review of Systems  Constitutional:  Negative for chills, diaphoresis and fever.  HENT:  Negative for congestion and sore throat.   Eyes:  Negative for blurred vision.  Respiratory:  Negative for cough, shortness of breath and wheezing.   Cardiovascular:  Negative for chest pain and palpitations.  Gastrointestinal:  Negative for abdominal pain, constipation, diarrhea, heartburn, nausea and vomiting.  Genitourinary:  Negative for dysuria.  Musculoskeletal:  Negative for joint pain and myalgias.  Skin:  Negative for itching and rash.  Neurological:  Negative for dizziness, tingling, tremors, sensory change, speech change, focal weakness, seizures, loss of consciousness, weakness and headaches.  Endo/Heme/Allergies:        NKDA  Psychiatric/Behavioral:  Positive for depression and substance abuse (Hx alcoholism. BAL 288.). Negative for hallucinations, memory loss and suicidal ideas (Hx of). The patient is nervous/anxious and has insomnia.    Blood pressure 135/85, pulse (!) 51, temperature 98.1 F (36.7 C), temperature source Oral, resp. rate 18, height 5\' 7"  (1.702 m), weight 67.9 kg, last menstrual period 01/26/2023, SpO2 100 %. Body mass index is 23.43 kg/m.  Treatment Plan Summary: Daily contact with patient to assess and evaluate symptoms and progress in treatment and Medication management.   Principal/active diagnoses.  Alcohol use disorder, severe/chronic. Generalized anxiety disorder.  Plan: -Continue the [facility] detox protocols for alcohol withdrawal management.  -Resumed on gabapentin 300 mg po qid for alcohol withdrawal syndrome.  -Continue Hydroxyzine 25 mg po Qid prn for anxiety.  -Continue Propranolol 10 mg po tid for anxiety.  -Buspar 10 po tid for anxiety.   Other PRNS -Continue Tylenol 650 mg every 6 hours PRN for mild pain -Continue Maalox 30  ml Q 4 hrs PRN for indigestion -Continue MOM 30  ml po Q 6 hrs for constipation  Safety and Monitoring: Voluntary admission to inpatient psychiatric unit for safety, stabilization and treatment Daily contact with patient to assess and evaluate symptoms and progress in treatment Patient's case to be discussed in multi-disciplinary team meeting Observation Level : q15 minute checks Vital signs: q12 hours Precautions: Safety  Discharge Planning: Social work and case management to assist with discharge planning and identification of hospital follow-up needs prior to discharge Estimated LOS: 5-7 days Discharge Concerns: Need to establish a safety plan; Medication compliance and effectiveness Discharge Goals: Return home with outpatient referrals for mental health follow-up including medication management/psychotherapy  Observation Level/Precautions:  15 minute checks  Laboratory:   Per ED, current lab results reviewed, BAL 288, will obtain hgba1c, lipid panel, tsh & urinalysis.Marland Kitchen  Psychotherapy: Enrolled in the group sessions    Medications: See MAR.   Consultations: As needed.  Discharge Concerns: Safety, mood stability.   Estimated LOS: 3-5 days.  Other: NA    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[place])  Long Term Goal(s): Improvement in symptoms so as ready for discharge  Short Term Goals: Ability to identify changes in lifestyle to reduce recurrence of condition will improve, Ability to verbalize feelings will improve, Ability to disclose and discuss suicidal ideas, and Ability to demonstrate self-control will improve  Physician Treatment Plan for Secondary Diagnosis: Principal Problem:   Alcohol-induced mood disorder with depressive symptoms ([place])  Long Term Goal(s): Improvement in symptoms so as ready for discharge  Short Term Goals: Ability to identify and develop effective coping behaviors will improve, Ability to maintain clinical measurements within normal  limits will improve, Compliance with prescribed medications will improve, and Ability to identify triggers associated with substance abuse/mental health issues will improve  I certify that inpatient services furnished can reasonably be expected to improve the patient's condition.    Lindell Spar, NP, pmhnp, fnp-bc 3/27/20248:55 AM

## 2023-01-27 NOTE — Progress Notes (Signed)
D:  Patient's self inventory sheet, patient sleeps good, no sleep medication.  Fair appetite, normal energy level, good concentration.  Rated depression 1, denied hopeless, anxiety 4.  Denied withdrawals.  Denied SI.  Physical problems none.  Rated pain in past 24 hours #1.  Goal is to listen today.  Plans to participate in group and gain perspective. A:  Medications administered per MD orders.  Emotional support and encouragement given patient. R:  Denied SI and HI, contracts for safety.  Denied A/V hallucinations.  Safety maintained with 15 minute checks.

## 2023-01-27 NOTE — BHH Suicide Risk Assessment (Signed)
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  Continued Clinical Symptoms:  Alcohol Use Disorder Identification Test Final Score (AUDIT): 19 The "Alcohol Use Disorders Identification Test", Guidelines for Use in Primary Care, Second Edition.  World Pharmacologist [facility]). Score between 0-7:  no or low risk or alcohol related problems. Score between 8-15:  moderate risk of alcohol related problems. Score between 16-19:  high risk of alcohol related problems. Score 20 or above:  warrants further diagnostic evaluation for alcohol dependence and treatment.  CLINICAL FACTORS:   Severe Anxiety and/or Agitation Depression:   Comorbid alcohol abuse/dependence Hopelessness Impulsivity Insomnia Alcohol/Substance Abuse/Dependencies More than one psychiatric diagnosis Unstable or Poor Therapeutic Relationship Previous Psychiatric Diagnoses and Treatments  Musculoskeletal: Strength & Muscle Tone: within normal limits Gait & Station: normal Patient leans: N/A  Psychiatric Specialty Exam:  Presentation  General Appearance:  Appropriate for Environment  Eye Contact: Fair  Speech: Clear and Coherent; Normal Rate  Speech Volume: Normal  Handedness: Right   Mood and Affect  Mood: Anxious; Dysphoric  Affect: Tearful; Depressed  Thought Process  Thought Processes: Coherent; Goal  Directed; Linear  Descriptions of Associations:Intact  Orientation:Full (Time, Place and Person)  Thought Content:Logical  History of Schizophrenia/Schizoaffective disorder:No  Duration of Psychotic Symptoms:No data recorded Hallucinations:Hallucinations: None  Ideas of Reference:None  Suicidal Thoughts:Suicidal Thoughts: No (Patient denies suicidal.  IVC with report of suicidal statement)  Homicidal Thoughts:Homicidal Thoughts: No  Sensorium  Memory: Immediate Good; Recent Good; Remote Good  Judgment: Fair  Insight: Present  Executive Functions  Concentration: Good  Attention Span: Good  Recall: Good  Fund of Knowledge: Good  Language: Good  Psychomotor Activity  Psychomotor Activity: Psychomotor Activity: Normal  Assets  Assets: Communication Skills; Desire for Improvement; Financial Resources/Insurance; Physical Health; Social Support; Transportation; Vocational/Educational  Sleep  Sleep: Sleep: Good  Physical Exam: See H&P. Blood pressure 135/85, pulse (!) 51, temperature 98.1 F (36.7 C), temperature source Oral, resp. rate 18, height 5\' 7"  (1.702 m), weight 67.9 kg, last menstrual period 01/26/2023, SpO2 100 %. Body mass index is 23.43 kg/m.  COGNITIVE FEATURES THAT CONTRIBUTE TO RISK:  Closed-mindedness, Loss of executive function, Polarized thinking, and Thought constriction (tunnel vision)    SUICIDE RISK:   Severe:  Frequent, intense, and enduring suicidal ideation, specific plan, no subjective intent, but some objective markers of intent (i.e., choice of lethal method), the method is accessible, some limited preparatory behavior, evidence of impaired self-control, severe dysphoria/symptomatology, multiple risk factors present, and few if any protective factors, particularly a lack of social support.  PLAN OF CARE: See H&P.  I certify that inpatient services furnished can reasonably be expected to improve the patient's condition.    Lindell Spar, NP, pmhnp, fnp-bc 01/27/2023, 9:02 AM

## 2023-01-27 NOTE — Progress Notes (Signed)
Anne Stewart is a 37 y.o. female involuntarily admitted for due to excessive use of alcohol. Pt was IVC by her parents after pt stated that she wanted to kill herself by slitting her wrist and then bleed to death. During admission, pt stated that she did not say or rather she does not remember saying that. Pt does agree to drinking, but don't think she need help. Pt denied SI/HI and contracted for safety, alert and oriented x 4. Consents signed, skin/belongings search completed and pt oriented to unit. Pt stable at this time. Pt given the opportunity to express concerns and ask questions. Pt given toiletries. Will continue to monitor.

## 2023-01-27 NOTE — BH IP Treatment Plan (Unsigned)
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[place])   Current Medications:  Current Facility-Administered Medications  Medication Dose Route Frequency Provider Last Rate Last Admin   acetaminophen (TYLENOL) tablet 650 mg  650 mg Oral Q4H PRN Rankin, Shuvon B, NP       chlordiazePOXIDE (LIBRIUM) capsule 25 mg  25 mg Oral Q6H PRN Rankin, Shuvon B, NP       chlordiazePOXIDE (LIBRIUM) capsule 25 mg  25 mg Oral QID Rankin, Shuvon B, NP   25 mg at 01/27/23 0900   Followed by   Derrill Memo ON 01/28/2023] chlordiazePOXIDE (LIBRIUM) capsule 25 mg  25 mg Oral TID Rankin, Shuvon B, NP       Followed by   Derrill Memo ON 01/29/2023] chlordiazePOXIDE (LIBRIUM) capsule 25 mg  25 mg Oral BH-qamhs Rankin, Shuvon B, NP       Followed by   Derrill Memo ON 01/30/2023] chlordiazePOXIDE (LIBRIUM) capsule 25 mg  25 mg Oral Daily Rankin, Shuvon B, NP       gabapentin (NEURONTIN) capsule 300 mg  300 mg Oral TID Rankin, Shuvon B, NP   300 mg at 01/27/23 0900   hydrOXYzine (ATARAX) tablet 25 mg  25 mg Oral Q6H PRN Rankin, Shuvon B, NP       loperamide (IMODIUM) capsule 2-4 mg  2-4 mg Oral PRN Rankin, Shuvon B, NP       modafinil (PROVIGIL) tablet 200 mg  200 mg Oral Daily Massengill, Nathan, MD   200 mg at 01/27/23 0900   multivitamin with minerals tablet 1 tablet  1 tablet Oral Daily Rankin, Shuvon B, NP   1 tablet at 01/27/23 0900   ondansetron (ZOFRAN-ODT) disintegrating tablet 4 mg  4 mg Oral Q6H PRN Rankin, Shuvon B, NP       propranolol (INDERAL) tablet 10 mg  10 mg Oral QID Rankin, Shuvon B, NP   10 mg at 01/27/23 0900   traZODone (DESYREL) tablet 50 mg  50 mg Oral QHS PRN,MR X 1 Rankin, Shuvon B, NP       PTA Medications: Medications Prior to Admission   Medication Sig Dispense Refill Last Dose   Armodafinil 250 MG tablet Take 250 mg by mouth daily.      gabapentin (NEURONTIN) 300 MG capsule Take 300 mg by mouth 4 (four) times daily.      naltrexone (DEPADE) 50 MG tablet Take 50 mg by mouth daily.      propranolol (INDERAL) 10 MG tablet Take 10 mg by mouth 4 (four) times daily.      traZODone (DESYREL) 50 MG tablet Take 50-100 mg by mouth at bedtime.       Patient Stressors: Marital or family conflict   Medication change or noncompliance   Substance abuse    Patient Strengths: Scientist, research (life sciences)  Physical Health  Supportive family/friends  Work skills   Treatment Modalities: Medication Management, Group therapy, Case management,  1 to 1 session with clinician, Psychoeducation, Recreational therapy.   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[place]) Long Term Goal(s): Improvement in symptoms so as ready for discharge   Short Term Goals: Ability to identify and develop effective coping behaviors will improve Ability to maintain clinical measurements within normal limits will improve Compliance with prescribed medications will improve Ability to  identify triggers associated with substance abuse/mental health issues will improve Ability to identify changes in lifestyle to reduce recurrence of condition will improve Ability to verbalize feelings will improve Ability to disclose and discuss suicidal ideas Ability to demonstrate self-control will improve  Medication Management: Evaluate patient's response, side effects, and tolerance of medication regimen.  Therapeutic Interventions: 1 to 1 sessions, Unit Group sessions and Medication administration.  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[place])  Long Term Goal(s): Improvement in symptoms so as  ready for discharge   Short Term Goals: Ability to identify and develop effective coping behaviors will improve Ability to maintain clinical measurements within normal limits will improve Compliance with prescribed medications will improve Ability to identify triggers associated with substance abuse/mental health issues will improve Ability to identify changes in lifestyle to reduce recurrence of condition will improve Ability to verbalize feelings will improve Ability to disclose and discuss suicidal ideas Ability to demonstrate self-control will improve     Medication Management: Evaluate patient's response, side effects, and tolerance of medication regimen.  Therapeutic Interventions: 1 to 1 sessions, Unit Group sessions and Medication administration.  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[place]) Long Term Goal(s): Knowledge of disease and therapeutic regimen to maintain health will improve  Short Term Goals: Ability to remain free from injury will improve, Ability to verbalize frustration and anger appropriately will improve, Ability to demonstrate self-control, Ability to participate in decision making will improve, Ability to verbalize feelings will improve, Ability to disclose and discuss suicidal ideas, Ability to identify and develop effective coping behaviors will improve, and Compliance with prescribed medications will improve  Medication Management: RN will administer medications as ordered by provider, will assess and evaluate patient's response and provide education to patient for prescribed medication. RN will report any adverse and/or side effects to prescribing provider.  Therapeutic Interventions: 1 on 1 counseling sessions, Psychoeducation, Medication administration, Evaluate responses to treatment, Monitor vital signs and CBGs as ordered, Perform/monitor CIWA, COWS, AIMS and  Fall Risk screenings as ordered, Perform wound care treatments as ordered.  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  Short Term Goals: Engage patient in aftercare planning with referrals and resources, Increase social support, Increase ability to appropriately verbalize feelings, Increase emotional regulation, Facilitate acceptance of mental health diagnosis and concerns, Facilitate patient progression through stages of change regarding substance use diagnoses and concerns, Identify triggers associated with mental health/substance abuse issues, and Increase skills for wellness and recovery  Therapeutic Interventions: Assess for all discharge needs, 1 to 1 time with Social worker, Explore available resources and support systems, Assess for adequacy in community support network, Educate family and significant other(s) on suicide prevention, Complete Psychosocial Assessment, Interpersonal group therapy.  Evaluation of Outcomes: Not Progressing   Progress in Treatment: Attending groups: No. Participating in groups: No. Taking medication as prescribed: {BHH ADULT:22608} Toleration medication: {BHH ADULT:22608} Family/Significant other contact made: {YES/NO/CONTACT:22665} Patient understands diagnosis: {BHH YO:[id number] Discussing patient identified problems/goals with staff: {BHH YO:[id number] Medical problems stabilized or resolved: {BHH ADULT:22608} Denies suicidal/homicidal ideation: {BHH ADULT:22608} Issues/concerns per patient self-inventory: {BHH YO:[id number] Other: ***  New problem(s) identified: {BHH NEW PROBLEMS:22609}  New Short Term/Long Term Goal(s):  Patient Goals:    Discharge Plan or Barriers:   Reason for  Continuation of Hospitalization: {BHH Reasons for continued hospitalization:22604}  Estimated Length of Stay:  Last [place] Suicide Severity Risk Score: Flowsheet Row Admission (Current) from 01/26/2023 in [facility] ED from 01/25/2023 in [facility] Emergency Department at [place] No Risk Moderate Risk       Last PHQ 2/9 Scores:     No data to display          Scribe for Treatment Team: Charlett Lango 01/27/2023 11:55 AM

## 2023-01-27 NOTE — BHH Group Notes (Signed)
[place] Group Notes:  (Nursing/MHT/Case Management/Adjunct)  Date:  01/27/2023  Time:  9:56 AM  Type of Therapy:  Group Therapy  Participation Level:  Did Not Attend  Summary of Progress/Problems:  Patient did not attend goals/ orientation group today.   Elza Rafter 01/27/2023, 9:56 AM

## 2023-01-27 NOTE — Group Note (Signed)
Recreation Therapy Group Note   Group Topic:Team Building  Group Date: 01/27/2023 Start Time: 0930 End Time: 1000 Facilitators: Seline Enzor-McCall, LRT,CTRS Location: [address] Dayroom   Goal Area(s) Addresses:  Patient will effectively work with peer towards shared goal.  Patient will identify skills used to make activity successful.  Patient will identify how skills used during activity can be applied to reach post d/c goals.   Group Description: [employer]. In teams of 5-6, patients were given 11 craft pipe cleaners. Using the materials provided, patients were instructed to compete again the opposing team(s) to build the tallest free-standing structure from floor level. The activity was timed; difficulty increased by Probation officer as Pharmacist, hospital continued.  Systematically resources were removed with additional directions for example, placing one arm behind their back, working in silence, and shape stipulations. LRT facilitated post-activity discussion reviewing team processes and necessary communication skills involved in completion. Patients were encouraged to reflect how the skills utilized, or not utilized, in this activity can be incorporated to positively impact support systems post discharge.   Affect/Mood: N/A   Participation Level: Did not attend    Clinical Observations/Individualized Feedback:      Plan: Continue to engage patient in RT group sessions 2-3x/week.   Michaiah Holsopple-McCall, LRT,CTRS 01/27/2023 12:10 PM

## 2023-01-27 NOTE — BHH Group Notes (Signed)
Pt attend N/A

## 2023-01-27 NOTE — Progress Notes (Signed)
   01/27/23 0601  15 Minute Checks  Location Bedroom  Visual Appearance Calm  Behavior Composed  Sleep (Behavioral Health Patients Only)  Calculate sleep? (Click Yes once per 24 hr at 0600 safety check) Yes  Documented sleep last 24 hours 4.5

## 2023-01-28 DIAGNOSIS — F1094 Alcohol use, unspecified with alcohol-induced mood disorder: Secondary | ICD-10-CM | POA: Diagnosis not present

## 2023-01-28 MED ORDER — NALTREXONE HCL 50 MG PO TABS
50.0000 mg | ORAL_TABLET | Freq: Every day | ORAL | Status: DC
  Administered 2023-01-28 – 2023-01-30 (×3): 50 mg via ORAL
  Filled 2023-01-28 (×6): qty 1

## 2023-01-28 MED ORDER — CLONIDINE HCL 0.2 MG PO TABS
0.2000 mg | ORAL_TABLET | Freq: Once | ORAL | Status: DC
  Filled 2023-01-28: qty 1

## 2023-01-28 MED ORDER — NALTREXONE HCL 50 MG PO TABS: 50.0000 mg | ORAL_TABLET | Freq: Every day | ORAL | Status: DC

## 2023-01-28 NOTE — BHH Group Notes (Signed)
PsychoEducational Group Patients were asked to reflect on how to promote wellbeing with healthy lifestyle choices. Education regarding healthy sleep habits, and lifestyle impact mental health. Conversely patients were then asked to identify warning signs of when they are not well and how to integrate healthy coping skills/lifestyle to help.  Patient attended and was removed from group by Provider, returned to group at the end and did participate, but missed majority.

## 2023-01-28 NOTE — Progress Notes (Signed)
Pt remains alert and oriented. Pt denies withdrawal symptoms, endorses anxiety however states that she always has anxiety. Slight hand tremor noted bilaterally this morning. Pt noted to be hypertensive. Pt non accepting to education about alcohol withdrawal. Pt minimizing alcohol use. Pt refused librium noting somnolence. New order noted for clonidine, pt refusing that as well stating her BP came down after inderal so she does not want to take clonidine. Pt encouraged to seek out staff if withdrawal symptoms occur. Pt denies SI/HI/self harm thoughts as well as a/v hallucinations. Q 15 minute checks ongoing for safety.

## 2023-01-28 NOTE — BHH Group Notes (Signed)
[place] Group Notes:  (Nursing/MHT/Case Management/Adjunct)  Date:  01/28/2023  Time:  8:54 PM  Type of Therapy:  Group Therapy  Participation Level:  Active  Participation Quality:  Appropriate  Affect:  Appropriate  Cognitive:  Appropriate  Insight:  Appropriate  Engagement in Group:  Engaged  Modes of Intervention:  Discussion  Summary of Progress/Problems: Goal was to work on strategies to stay sober  Chase Picket 01/28/2023, 8:54 PM

## 2023-01-28 NOTE — Plan of Care (Signed)
  Problem: Coping: Goal: Will verbalize feelings Outcome: Not Progressing

## 2023-01-28 NOTE — Group Note (Signed)
Date:  01/28/2023 Time:  5:19 PM  Group Topic/Focus:  Spirituality:   The focus of this group is to discuss how one's spirituality can aide in recovery.    Participation Level:  Active  Participation Quality:  Appropriate  Affect:  Appropriate  Cognitive:  Appropriate  Insight: Appropriate  Engagement in Group:  Engaged  Modes of Intervention:  Exploration  Additional Comments:     Jerrye Beavers 01/28/2023, 5:19 PM

## 2023-01-28 NOTE — Group Note (Unsigned)
Date:  01/28/2023 Time:  10:13 AM  Group Topic/Focus:  Orientation:   The focus of this group is to educate the patient on the purpose and policies of crisis stabilization and provide a format to answer questions about their admission.  The group details unit policies and expectations of patients while admitted.     Participation Level:  {BHH PARTICIPATION WO:[id number]  Participation Quality:  {BHH PARTICIPATION QUALITY:22265}  Affect:  {BHH AFFECT:22266}  Cognitive:  {BHH COGNITIVE:22267}  Insight: {BHH Insight2:20797}  Engagement in Group:  {BHH ENGAGEMENT IN BP:[id number]  Modes of Intervention:  {BHH MODES OF INTERVENTION:22269}  Additional Comments:  ***  Jerrye Beavers 01/28/2023, 10:13 AM

## 2023-01-28 NOTE — Progress Notes (Signed)
   01/28/23 2200  Psych Admission Type (Psych Patients Only)  Admission Status Involuntary  Psychosocial Assessment  Patient Complaints Anxiety  Eye Contact Fair  Facial Expression Flat  Affect Anxious;Depressed  Speech Logical/coherent  Interaction Assertive  Motor Activity Slow  Appearance/Hygiene Unremarkable  Behavior Characteristics Cooperative  Mood Anxious;Depressed  Thought Process  Coherency WDL  Content Blaming others  Delusions None reported or observed  Perception WDL  Hallucination None reported or observed  Judgment Poor  Confusion None  Danger to Self  Current suicidal ideation? Denies  Danger to Others  Danger to Others None reported or observed   Alert/oriented. Makes needs/concerns known to staff. Pleasant cooperative with staff. Denies SI/HI/A/V hallucinations. Med compliant. PRN med given with good effect. Patient states went to group. Will encourage continued compliance and progression towards goals. Verbally contracted for safety. Will continue to monitor. CIWA continued.

## 2023-01-28 NOTE — Progress Notes (Signed)
[facility] MD Progress Note  01/28/2023 3:45 PM Anne Stewart  MRN:  [id number]  Reason for admission: 37 year old Caucasian female with hx of alcohol use disorder, chronic. Admitted to the [facility] from the [facility] with complaint of suicidal ideations with plan to slit her wrists. Patient was at the time intoxicated. Her BAL at the ED was 413 which later dropped down to 288. Her UDS was also positive for THC. Patient is admitted to the [facility] under IVC by the police. Chart review indicated that patient was receiving mental health services on an outpatient basis with Dr. Toy Care.   Daily notes: Anne Stewart is seen, chart reviewed. The chart findings discussed with the treatment team. She presents alert, oriented, anxious & overly worried about getting discharged. She tearfully reports, "My mood is pretty good. I do not feel depressed at all. My problems is anxiety. I'm getting more anxious & worried because I'm here. I cannot afford to continue to be here. I have to be at work on Monday. 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 I have taken off too many times off from work. My children are returning home on Saturday. Keeping me here is not going to help me. I know you guys have mentioned that my blood pressure was high. I know why, it is because I'm uptight. I don't need blood pressure medicines here. I will rather see my primary care provider once I get discharged. There is no one here who is hearing me. I have got to be discharged from here. I'm not going to benefit any further from being here. I have a psychiatrist out there in the community. I was attending substance abuse treatment meetings at the [facility]. I plan on resuming this meetings after discharge". Anne Stewart currently denies any SIHI, AVH, delusional thoughts or paranoia. She does not appear to be responding to any internal stimuli. Patient has been resumed on her Naltrexone. She remained on her other medications as already in progress.  Patient has been informed that her tentative discharge date is Sunday 01-31-23. She became tearful after hearing this information. She is currently in no apparent distress.  Principal Problem: Alcohol-induced mood disorder with depressive symptoms (HCC)  Diagnosis: Principal Problem:   Alcohol-induced mood disorder with depressive symptoms (HCC) Active Problems:   Alcohol use disorder, severe, dependence ([place])  Total Time spent with patient:  35 minutes  Past Psychiatric History: See H&P.  Past Medical History: History reviewed. No pertinent past medical history. History reviewed. No pertinent surgical history.  Family History: History reviewed. No pertinent family history.  Family Psychiatric  History: See H&P.  Social History:  Social History   Substance and Sexual Activity  Alcohol Use Yes     Social History   Substance and Sexual Activity  Drug Use Not Currently    Social History   Socioeconomic History   Marital status: Single    Spouse name: Not on file   Number of children: Not on file   Years of education: Not on file   Highest education level: Not on file  Occupational History   Not on file  Tobacco Use   Smoking status: Never   Smokeless tobacco: Not on file  Substance and Sexual Activity   Alcohol use: Yes   Drug use: Not Currently   Sexual activity: Not on file  Other Topics Concern   Not on file  Social History Narrative   Not on file   Social Determinants of Health  Financial Resource Strain: Not on file  Food Insecurity: Not on file  Transportation Needs: Not on file  Physical Activity: Not on file  Stress: Not on file  Social Connections: Not on file   Additional Social History:   Sleep: Good  Appetite:  Good  Current Medications: Current Facility-Administered Medications  Medication Dose Route Frequency Provider Last Rate Last Admin   acetaminophen (TYLENOL) tablet 650 mg  650 mg Oral Q4H PRN Rankin, Shuvon B, NP       alum &  mag hydroxide-simeth (MAALOX/MYLANTA) 200-200-20 MG/5ML suspension 30 mL  30 mL Oral Q4H PRN Trevante Tennell I, NP       busPIRone (BUSPAR) tablet 10 mg  10 mg Oral TID Lindell Spar I, NP   10 mg at 01/28/23 1206   chlordiazePOXIDE (LIBRIUM) capsule 25 mg  25 mg Oral Q6H PRN Rankin, Shuvon B, NP       chlordiazePOXIDE (LIBRIUM) capsule 25 mg  25 mg Oral TID Rankin, Shuvon B, NP       Followed by   Derrill Memo ON 01/29/2023] chlordiazePOXIDE (LIBRIUM) capsule 25 mg  25 mg Oral BH-qamhs Rankin, Shuvon B, NP       Followed by   Derrill Memo ON 01/30/2023] chlordiazePOXIDE (LIBRIUM) capsule 25 mg  25 mg Oral Daily Rankin, Shuvon B, NP       cloNIDine (CATAPRES) tablet 0.1 mg  0.1 mg Oral BID PRN Nicholes Rough, NP       cloNIDine (CATAPRES) tablet 0.2 mg  0.2 mg Oral Once Lindell Spar I, NP       gabapentin (NEURONTIN) capsule 300 mg  300 mg Oral TID PC & HS Matvey Llanas I, NP   300 mg at 01/28/23 1206   hydrOXYzine (ATARAX) tablet 25 mg  25 mg Oral Q6H PRN Rankin, Shuvon B, NP   25 mg at 01/27/23 1833   loperamide (IMODIUM) capsule 2-4 mg  2-4 mg Oral PRN Rankin, Shuvon B, NP       magnesium hydroxide (MILK OF MAGNESIA) suspension 30 mL  30 mL Oral Daily PRN Lindell Spar I, NP       modafinil (PROVIGIL) tablet 200 mg  200 mg Oral Daily Massengill, Nathan, MD   200 mg at 01/28/23 0809   multivitamin with minerals tablet 1 tablet  1 tablet Oral Daily Rankin, Shuvon B, NP   1 tablet at 01/28/23 0809   ondansetron (ZOFRAN-ODT) disintegrating tablet 4 mg  4 mg Oral Q6H PRN Rankin, Shuvon B, NP       propranolol (INDERAL) tablet 10 mg  10 mg Oral TID Lindell Spar I, NP   10 mg at 01/28/23 1206   traZODone (DESYREL) tablet 50 mg  50 mg Oral QHS PRN,MR X 1 Rankin, Shuvon B, NP        Lab Results:  No results found for this or any previous visit (from the past 48 hour(s)).  Blood Alcohol level:  Lab Results  Component Value Date   ETH 288 (H) 01/26/2023   ETH 413 (HH) [id number]   Metabolic Disorder Labs: No  results found for: "HGBA1C", "MPG" No results found for: "PROLACTIN" No results found for: "CHOL", "TRIG", "HDL", "CHOLHDL", "VLDL", "LDLCALC"  Physical Findings: AIMS:  , ,  ,  ,    CIWA:  CIWA-Ar Total: 2 COWS:     Musculoskeletal: Strength & Muscle Tone: within normal limits Gait & Station: normal Patient leans: N/A  Psychiatric Specialty Exam:  Presentation  General Appearance:  Casual; Fairly Groomed  Eye Contact: Fair  Speech: Clear and Coherent; Normal Rate  Speech Volume: Normal  Handedness: Right   Mood and Affect  Mood: Anxious; Depressed  Affect: Congruent; Flat; Tearful; Depressed  Thought Process  Thought Processes: Coherent; Goal Directed; Linear  Descriptions of Associations:Intact  Orientation:Full (Time, Place and Person)  Thought Content:Logical  History of Schizophrenia/Schizoaffective disorder:No  Duration of Psychotic Symptoms:No data recorded Hallucinations:Hallucinations: None  Ideas of Reference:None  Suicidal Thoughts:Suicidal Thoughts: No  Homicidal Thoughts:Homicidal Thoughts: No   Sensorium  Memory: Immediate Good; Recent Good; Remote Good  Judgment: Fair  Insight: Fair  Community education officer  Concentration: Fair  Attention Span: Fair  Recall: Good  Fund of Knowledge: Good  Language: Good  Psychomotor Activity  Psychomotor Activity:Psychomotor Activity: Normal   Assets  Assets: Communication Skills; Desire for Improvement; Financial Resources/Insurance; Housing; Social Support; Resilience; Physical Health  Sleep  Sleep:Sleep: Good Number of Hours of Sleep: 7.5  Physical Exam: Physical Exam Vitals and nursing note reviewed.  HENT:     Head: Normocephalic.     Nose: Nose normal.     Mouth/Throat:     Pharynx: Oropharynx is clear.  Eyes:     Pupils: Pupils are equal, round, and reactive to light.  Cardiovascular:     Rate and Rhythm: Normal rate.     Pulses: Normal pulses.      Comments: Elevated diastolic b/p: [id number].   Will recheck b/p. Pulmonary:     Effort: Pulmonary effort is normal.  Genitourinary:    Comments: Deferred Musculoskeletal:        General: Normal range of motion.     Cervical back: Normal range of motion.  Skin:    General: Skin is warm and dry.  Neurological:     General: No focal deficit present.     Mental Status: She is alert and oriented to person, place, and time.    Review of Systems  Constitutional:  Negative for chills, diaphoresis, fever and malaise/fatigue.  HENT:  Negative for congestion and sore throat.   Eyes:  Negative for blurred vision.  Respiratory:  Negative for cough, shortness of breath (Hx. alcoholism) and wheezing.   Cardiovascular:  Negative for chest pain and palpitations.  Gastrointestinal:  Negative for abdominal pain, constipation, diarrhea, heartburn, nausea and vomiting.  Genitourinary:  Negative for dysuria.  Musculoskeletal:  Negative for joint pain and myalgias.  Skin:  Negative for itching and rash.  Neurological:  Negative for dizziness, tingling, tremors, sensory change, speech change, focal weakness, seizures, loss of consciousness, weakness and headaches.  Endo/Heme/Allergies:        NKDA  Psychiatric/Behavioral:  Positive for depression and substance abuse. Negative for hallucinations, memory loss and suicidal ideas. The patient is nervous/anxious. The patient does not have insomnia.    Blood pressure (!) 125/93, pulse 72, temperature 97.6 F (36.4 C), temperature source Oral, resp. rate 16, height 5\' 7"  (1.702 m), weight 67.9 kg, last menstrual period 01/26/2023, SpO2 100 %. Body mass index is 23.43 kg/m.  Treatment Plan Summary: Daily contact with patient to assess and evaluate symptoms and progress in treatment and Medication management.   Continue inpatient hospitalization.  Will continue today 01/28/2023 plan as below except where it is noted.   Principal/active diagnoses.  Alcohol use  disorder, severe/chronic. Generalized anxiety disorder.  Plan: -Continue the CIWA detox protocols for alcohol withdrawal management.  -Continue gabapentin 300 mg po qid for alcohol withdrawal syndrome.  -Continue Hydroxyzine 25 mg po Qid prn for anxiety.  -Continue Propranolol 10  mg po tid for anxiety.  -Continue Buspar 10 po tid for anxiety.  -Resumed Naltrexone 50 mg po daily for alcoholism.   Other PRNS -Continue Tylenol 650 mg every 6 hours PRN for mild pain -Continue Maalox 30 ml Q 4 hrs PRN for indigestion -Continue MOM 30 ml po Q 6 hrs for constipation   Safety and Monitoring: Voluntary admission to inpatient psychiatric unit for safety, stabilization and treatment Daily contact with patient to assess and evaluate symptoms and progress in treatment Patient's case to be discussed in multi-disciplinary team meeting Observation Level : q15 minute checks Vital signs: q12 hours Precautions: Safety   Discharge Planning: Social work and case management to assist with discharge planning and identification of hospital follow-up needs prior to discharge Estimated LOS: 5-7 days Discharge Concerns: Need to establish a safety plan; Medication compliance and effectiveness Discharge Goals: Return home with outpatient referrals for mental health follow-up including medication management/psychotherapy   Lindell Spar, NP, pmhnp, fnp-bc. 01/28/2023, 3:45 PM

## 2023-01-29 ENCOUNTER — Encounter (HOSPITAL_COMMUNITY): Payer: Self-pay | Admitting: Psychiatry

## 2023-01-29 DIAGNOSIS — F1094 Alcohol use, unspecified with alcohol-induced mood disorder: Secondary | ICD-10-CM

## 2023-01-29 NOTE — [facility] Counselor (Signed)
[facility] LCSW Progress Note   01/29/2023    12:58 PM  Anne Stewart      Type of Note: Collateral for Safety Planning    CSW has reached to patient mom twice today, once at 12 pm and again at 12:50 pm. Also left mom a voicemail. CSW will continue to assist.    Signed:   Silas Flood, MSW, [facility] 01/29/2023 12:58 PM

## 2023-01-29 NOTE — Progress Notes (Signed)
Pt refused buspar. Pt was educated on benefits and uses. NP notified. Pt remains safe on Q15 min checks and contracts for safety.

## 2023-01-29 NOTE — Progress Notes (Signed)
Patient appears depressed. Patient denies SI/HI/AVH. Pt reports anxiety is 1/10 and depression is 2/10. Pt reports good sleep and appetite. Patient complied with morning medication with no reported side effects. Pt is minimizing of reason for admission. Patient remains safe on Q42min checks and contracts for safety.      01/29/23 1001  Psych Admission Type (Psych Patients Only)  Admission Status Involuntary  Psychosocial Assessment  Patient Complaints Depression  Eye Contact Fair  Facial Expression Flat  Affect Anxious;Apathetic  Speech Logical/coherent  Interaction Assertive  Motor Activity Slow  Appearance/Hygiene Unremarkable  Behavior Characteristics Cooperative;Irritable  Mood Anxious;Depressed;Irritable  Thought Process  Coherency WDL  Content Blaming others  Delusions None reported or observed  Perception WDL  Hallucination None reported or observed  Judgment Poor  Confusion None  Danger to Self  Current suicidal ideation? Denies  Agreement Not to Harm Self Yes  Description of Agreement verbal  Danger to Others  Danger to Others None reported or observed

## 2023-01-29 NOTE — Plan of Care (Signed)
  Problem: Education: Goal: Mental status will improve Outcome: Progressing   

## 2023-01-29 NOTE — Plan of Care (Signed)
  Problem: Education: Goal: Knowledge of [facility] Education information/materials will improve Outcome: Progressing Goal: Emotional status will improve Outcome: Progressing Goal: Mental status will improve Outcome: Progressing Goal: Verbalization of understanding the information provided will improve Outcome: Progressing   Problem: Coping: Goal: Coping ability will improve Outcome: Progressing Goal: Will verbalize feelings Outcome: Progressing   Problem: Health Behavior/Discharge Planning: Goal: Identification of resources available to assist in meeting health care needs will improve Outcome: Progressing   Problem: Education: Goal: Ability to state activities that reduce stress will improve Outcome: Progressing

## 2023-01-29 NOTE — Progress Notes (Signed)
[facility] MD Progress Note  01/29/2023 8:51 AM Anne Stewart  MRN:  [id number]  Reason for admission: 37 year old Caucasian female with hx of alcohol use disorder, chronic. Admitted to the [facility] from the [facility] with complaint of suicidal ideations with plan to slit her wrists. Patient was at the time intoxicated. Her BAL at the ED was 413 which later dropped down to 288. Her UDS was also positive for THC. Patient is admitted to the [facility] under IVC by the police. Chart review indicated that patient was receiving mental health services on an outpatient basis with Dr. Toy Care.   Daily notes: [name] is seen, chart reviewed. The chart findings discussed with the treatment team. She presents alert, oriented, anxious & overly worried about getting discharged. She presents with an improved affect today. She reports, "I'm good. The doctor told me that I will be going home tomorrow. I planned on resuming treatment with my regular psychiatrist once I get out of here. I slept well last night. I do not need any medicines for sleep. I tried it last night, but it did not feel right to me after I woke up this morning. I'm looking forward to going home tomorrow". The social worker reported has tried to call patient's mother for safety plan, mother did not answer the phone. Patient currently denies any SIHI, AVH, delusional thoughts or paranoia. She does not appear to be responding to any internal stimuli. See the current plan of care below. Patient is not compliant with her treatment plan. She is refusing her medications per the RN reports.  Principal Problem: Alcohol-induced mood disorder with depressive symptoms  Diagnosis: Principal Problem:   Alcohol-induced mood disorder with depressive symptoms (HCC) Active Problems:   Alcohol use disorder, severe, dependence ([place])  Total Time spent with patient:  35 minutes  Past Psychiatric History: See H&P.  Past Medical History: History reviewed. No pertinent  past medical history. History reviewed. No pertinent surgical history.  Family History: History reviewed. No pertinent family history.  Family Psychiatric  History: See H&P.  Social History:  Social History   Substance and Sexual Activity  Alcohol Use Yes     Social History   Substance and Sexual Activity  Drug Use Not Currently    Social History   Socioeconomic History   Marital status: Single    Spouse name: Not on file   Number of children: Not on file   Years of education: Not on file   Highest education level: Not on file  Occupational History   Not on file  Tobacco Use   Smoking status: Never   Smokeless tobacco: Not on file  Substance and Sexual Activity   Alcohol use: Yes   Drug use: Not Currently   Sexual activity: Not on file  Other Topics Concern   Not on file  Social History Narrative   Not on file   Social Determinants of Health   Financial Resource Strain: Not on file  Food Insecurity: Not on file  Transportation Needs: Not on file  Physical Activity: Not on file  Stress: Not on file  Social Connections: Not on file   Additional Social History:   Sleep: Good  Appetite:  Good  Current Medications: Current Facility-Administered Medications  Medication Dose Route Frequency Provider Last Rate Last Admin   acetaminophen (TYLENOL) tablet 650 mg  650 mg Oral Q4H PRN Rankin, Shuvon B, NP       alum & mag hydroxide-simeth (MAALOX/MYLANTA) 200-200-20 MG/5ML suspension 30  mL  30 mL Oral Q4H PRN Lindell Spar I, NP       busPIRone (BUSPAR) tablet 10 mg  10 mg Oral TID Lindell Spar I, NP   10 mg at 01/29/23 [id number]   chlordiazePOXIDE (LIBRIUM) capsule 25 mg  25 mg Oral Q6H PRN Rankin, Shuvon B, NP       chlordiazePOXIDE (LIBRIUM) capsule 25 mg  25 mg Oral BH-qamhs Rankin, Shuvon B, NP   25 mg at 01/29/23 [id number]   Followed by   Derrill Memo ON 01/30/2023] chlordiazePOXIDE (LIBRIUM) capsule 25 mg  25 mg Oral Daily Rankin, Shuvon B, NP       cloNIDine (CATAPRES)  tablet 0.1 mg  0.1 mg Oral BID PRN Nicholes Rough, NP       cloNIDine (CATAPRES) tablet 0.2 mg  0.2 mg Oral Once Lindell Spar I, NP       gabapentin (NEURONTIN) capsule 300 mg  300 mg Oral TID PC & HS Shaqueta Casady I, NP   300 mg at 01/29/23 [id number]   hydrOXYzine (ATARAX) tablet 25 mg  25 mg Oral Q6H PRN Rankin, Shuvon B, NP   25 mg at 01/27/23 1833   loperamide (IMODIUM) capsule 2-4 mg  2-4 mg Oral PRN Rankin, Shuvon B, NP       magnesium hydroxide (MILK OF MAGNESIA) suspension 30 mL  30 mL Oral Daily PRN Lindell Spar I, NP       modafinil (PROVIGIL) tablet 200 mg  200 mg Oral Daily Massengill, Nathan, MD   200 mg at 01/29/23 [id number]   multivitamin with minerals tablet 1 tablet  1 tablet Oral Daily Rankin, Shuvon B, NP   1 tablet at 01/29/23 0837   naltrexone (DEPADE) tablet 50 mg  50 mg Oral Daily Leonte Horrigan, Herbert Pun I, NP   50 mg at 01/29/23 0837   ondansetron (ZOFRAN-ODT) disintegrating tablet 4 mg  4 mg Oral Q6H PRN Rankin, Shuvon B, NP       propranolol (INDERAL) tablet 10 mg  10 mg Oral TID Lindell Spar I, NP   10 mg at 01/29/23 0838   traZODone (DESYREL) tablet 50 mg  50 mg Oral QHS PRN,MR X 1 Rankin, Shuvon B, NP   50 mg at 01/28/23 2158    Lab Results:  No results found for this or any previous visit (from the past 48 hour(s)).  Blood Alcohol level:  Lab Results  Component Value Date   ETH 288 (H) 01/26/2023   ETH 413 (HH) [id number]   Metabolic Disorder Labs: No results found for: "HGBA1C", "MPG" No results found for: "PROLACTIN" No results found for: "CHOL", "TRIG", "HDL", "CHOLHDL", "VLDL", "LDLCALC"  Physical Findings: AIMS:  , ,  ,  ,    CIWA:  CIWA-Ar Total: 2 COWS:     Musculoskeletal: Strength & Muscle Tone: within normal limits Gait & Station: normal Patient leans: N/A  Psychiatric Specialty Exam:  Presentation  General Appearance:  Casual; Fairly Groomed  Eye Contact: Fair  Speech: Clear and Coherent; Normal Rate  Speech  Volume: Normal  Handedness: Right   Mood and Affect  Mood: Anxious; Depressed  Affect: Congruent; Flat; Tearful; Depressed  Thought Process  Thought Processes: Coherent; Goal Directed; Linear  Descriptions of Associations:Intact  Orientation:Full (Time, Place and Person)  Thought Content:Logical  History of Schizophrenia/Schizoaffective disorder:No  Duration of Psychotic Symptoms:No data recorded Hallucinations:Hallucinations: None  Ideas of Reference:None  Suicidal Thoughts:Suicidal Thoughts: No  Homicidal Thoughts:Homicidal Thoughts: No   Sensorium  Memory: Immediate Good; Recent Good; Remote  Good  Judgment: Fair  Insight: Fair  Materials engineer: Fair  Attention Span: Fair  Recall: Roel Cluck of Knowledge: Good  Language: Good  Psychomotor Activity  Psychomotor Activity:Psychomotor Activity: Normal   Assets  Assets: Communication Skills; Desire for Improvement; Financial Resources/Insurance; Housing; Social Support; Resilience; Physical Health  Sleep  Sleep:Sleep: Good Number of Hours of Sleep: 7.5  Physical Exam: Physical Exam Vitals and nursing note reviewed.  HENT:     Head: Normocephalic.     Nose: Nose normal.     Mouth/Throat:     Pharynx: Oropharynx is clear.  Eyes:     Pupils: Pupils are equal, round, and reactive to light.  Cardiovascular:     Rate and Rhythm: Normal rate.     Pulses: Normal pulses.     Comments: Elevated diastolic b/p: 123XX123.   Will recheck b/p. Pulmonary:     Effort: Pulmonary effort is normal.  Genitourinary:    Comments: Deferred Musculoskeletal:        General: Normal range of motion.     Cervical back: Normal range of motion.  Skin:    General: Skin is warm and dry.  Neurological:     General: No focal deficit present.     Mental Status: She is alert and oriented to person, place, and time.    Review of Systems  Constitutional:  Negative for chills, diaphoresis,  fever and malaise/fatigue.  HENT:  Negative for congestion and sore throat.   Eyes:  Negative for blurred vision.  Respiratory:  Negative for cough, shortness of breath (Hx. alcoholism) and wheezing.   Cardiovascular:  Negative for chest pain and palpitations.  Gastrointestinal:  Negative for abdominal pain, constipation, diarrhea, heartburn, nausea and vomiting.  Genitourinary:  Negative for dysuria.  Musculoskeletal:  Negative for joint pain and myalgias.  Skin:  Negative for itching and rash.  Neurological:  Negative for dizziness, tingling, tremors, sensory change, speech change, focal weakness, seizures, loss of consciousness, weakness and headaches.  Endo/Heme/Allergies:        NKDA  Psychiatric/Behavioral:  Positive for depression and substance abuse. Negative for hallucinations, memory loss and suicidal ideas. The patient is nervous/anxious. The patient does not have insomnia.    Blood pressure (!) 130/92, pulse (!) 58, temperature (!) 97.5 F (36.4 C), temperature source Oral, resp. rate 16, height 5\' 7"  (1.702 m), weight 67.9 kg, last menstrual period 01/26/2023, SpO2 100 %. Body mass index is 23.43 kg/m.  Treatment Plan Summary: Daily contact with patient to assess and evaluate symptoms and progress in treatment and Medication management.   Continue inpatient hospitalization.  Will continue today 01/29/2023 plan as below except where it is noted.   Principal/active diagnoses.  Alcohol use disorder, severe/chronic. Generalized anxiety disorder.  Plan: -Continue the CIWA detox protocols for alcohol withdrawal management.  -Continue gabapentin 300 mg po qid for alcohol withdrawal syndrome.  -Continue Hydroxyzine 25 mg po Qid prn for anxiety.  -Continue Propranolol 10 mg po tid for anxiety.  -Continue Buspar 10 po tid for anxiety.  -Continue Naltrexone 50 mg po daily for alcoholism.   Other PRNS -Continue Tylenol 650 mg every 6 hours PRN for mild pain -Continue Maalox 30  ml Q 4 hrs PRN for indigestion -Continue MOM 30 ml po Q 6 hrs for constipation   Safety and Monitoring: Voluntary admission to inpatient psychiatric unit for safety, stabilization and treatment Daily contact with patient to assess and evaluate symptoms and progress in treatment Patient's case to be discussed  in multi-disciplinary team meeting Observation Level : q15 minute checks Vital signs: q12 hours Precautions: Safety   Discharge Planning: Social work and case management to assist with discharge planning and identification of hospital follow-up needs prior to discharge Estimated LOS: 5-7 days Discharge Concerns: Need to establish a safety plan; Medication compliance and effectiveness Discharge Goals: Return home with outpatient referrals for mental health follow-up including medication management/psychotherapy   Lindell Spar, NP, pmhnp, fnp-bc. 01/29/2023, 8:51 AMPatient ID: Nolon Stalls, female   DOB: 04-Jun-1986, 37 y.o.   MRN: [id number]

## 2023-01-29 NOTE — BHH Group Notes (Signed)
Adult Psychoeducational Group Note  Date:  01/29/2023 Time:  1:02 PM  Group Topic/Focus:  Goals Group:   The focus of this group is to help patients establish daily goals to achieve during treatment and discuss how the patient can incorporate goal setting into their daily lives to aide in recovery.  Participation Level:  Active  Participation Quality:  Appropriate  Affect:  Appropriate  Cognitive:  Appropriate  Insight: Appropriate  Engagement in Group:  Engaged  Modes of Intervention:  Activity and Discussion  Additional Comments:  Pt stated that her goal today is to work on being discharged and finishing her book. Pt was engaged throughout the duration of group.   Anne Stewart December 01/29/2023, 1:02 PM

## 2023-01-29 NOTE — Progress Notes (Signed)
   01/28/23 2200  Psych Admission Type (Psych Patients Only)  Admission Status Involuntary  Psychosocial Assessment  Patient Complaints Anxiety  Eye Contact Fair  Facial Expression Flat  Affect Anxious;Depressed  Speech Logical/coherent  Interaction Assertive  Motor Activity Slow  Appearance/Hygiene Unremarkable  Behavior Characteristics Cooperative  Mood Anxious;Depressed  Thought Process  Coherency WDL  Content Blaming others  Delusions None reported or observed  Perception WDL  Hallucination None reported or observed  Judgment Poor  Confusion None  Danger to Self  Current suicidal ideation? Denies  Danger to Others  Danger to Others None reported or observed   Alert/oriented.. Makes needs/concerns known to staff. Pleasant cooperative with staff. Denies SI/HI/A/V hallucinations. Patient states went to group. Will encourage continue compliance and progression towards goals. Verbally contracted for safety. Will continue to monitor.

## 2023-01-29 NOTE — Group Note (Signed)
Recreation Therapy Group Note   Group Topic:Stress Management  Group Date: 01/29/2023 Start Time: 0930 End Time: 0955 Facilitators: Arrielle Mcginn-McCall, LRT,CTRS Location: [address] Dayroom   Goal Area(s) Addresses:  Patient will identify positive stress management techniques. Patient will identify benefits of using stress management post d/c.  Group Description:  Meditation.  LRT explained to patients what group would consist of, they should focus on their breathing and be in a comfortable position.  LRT proceeded to play a meditation called 'Stop Holding Yourself Back'.  The meditation wanted patients to identify habits they that have prevented them from the life they want for themselves.  It also encouraged to hold onto the positive image they have of their future selves and to working to get to that point.   Affect/Mood: Appropriate   Participation Level: Engaged   Participation Quality: Independent   Behavior: Appropriate   Speech/Thought Process: Focused   Insight: Good   Judgement: Good   Modes of Intervention: App   Patient Response to Interventions:  Attentive   Education Outcome:  Acknowledges education and In group clarification offered    Clinical Observations/Individualized Feedback: Pt attended and participated in group session.    Plan: Continue to engage patient in RT group sessions 2-3x/week.   Nicolina Hirt-McCall, LRT,CTRS 01/29/2023 12:34 PM

## 2023-01-30 DIAGNOSIS — F1094 Alcohol use, unspecified with alcohol-induced mood disorder: Secondary | ICD-10-CM | POA: Diagnosis not present

## 2023-01-30 MED ORDER — BUSPIRONE HCL 10 MG PO TABS: 10.0000 mg | ORAL_TABLET | Freq: Three times a day (TID) | ORAL | 0 refills | Status: DC

## 2023-01-30 MED ORDER — GABAPENTIN 300 MG PO CAPS: 300.0000 mg | ORAL_CAPSULE | Freq: Three times a day (TID) | ORAL | 0 refills | Status: DC

## 2023-01-30 MED ORDER — PROPRANOLOL HCL 10 MG PO TABS: 10.0000 mg | ORAL_TABLET | Freq: Three times a day (TID) | ORAL | 0 refills | Status: AC

## 2023-01-30 MED ORDER — HALOPERIDOL LACTATE 5 MG/ML IJ SOLN: 5.0000 mg | Freq: Four times a day (QID) | INTRAMUSCULAR | Status: DC | PRN

## 2023-01-30 MED ORDER — DIPHENHYDRAMINE HCL 25 MG PO CAPS: 50.0000 mg | ORAL_CAPSULE | Freq: Every evening | ORAL | Status: DC | PRN

## 2023-01-30 MED ORDER — DIPHENHYDRAMINE HCL 50 MG/ML IJ SOLN: 50.0000 mg | Freq: Four times a day (QID) | INTRAMUSCULAR | Status: DC | PRN

## 2023-01-30 MED ORDER — LORAZEPAM 2 MG/ML IJ SOLN: 1.0000 mg | Freq: Four times a day (QID) | INTRAMUSCULAR | Status: DC | PRN

## 2023-01-30 MED ORDER — HALOPERIDOL 5 MG PO TABS: 5.0000 mg | ORAL_TABLET | Freq: Four times a day (QID) | ORAL | Status: DC | PRN

## 2023-01-30 MED ORDER — TRAZODONE HCL 50 MG PO TABS: 50.0000 mg | ORAL_TABLET | Freq: Every evening | ORAL | 0 refills | Status: AC | PRN

## 2023-01-30 MED ORDER — LORAZEPAM 1 MG PO TABS: 1.0000 mg | ORAL_TABLET | Freq: Four times a day (QID) | ORAL | Status: DC | PRN

## 2023-01-30 NOTE — BHH Group Notes (Signed)
[place] Group Notes:  (Nursing/MHT/Case Management/Adjunct)  Date:  01/30/2023  Time:  9:25 AM  Type of Therapy:  Group Therapy  Participation Level:  Active  Participation Quality:  Appropriate  Affect:  Appropriate  Cognitive:  Appropriate  Insight:  Appropriate  Engagement in Group:  Engaged  Modes of Intervention:  Discussion  Summary of Progress/Problems: Goal is discharge today  Chase Picket 01/30/2023, 9:25 AM

## 2023-01-30 NOTE — BHH Suicide Risk Assessment (Signed)
[place] INPATIENT:  Family/Significant Other Suicide Prevention Education  Suicide Prevention Education:  Education Completed; Olam Idler, mother, [phone],  (name of family member/significant other) has been identified by the patient as the family member/significant other with whom the patient will be residing, and identified as the person(s) who will aid the patient in the event of a mental health crisis (suicidal ideations/suicide attempt).  With written consent from the patient, the family member/significant other has been provided the following suicide prevention education, prior to the and/or following the discharge of the patient.  CSW and doctor spoke with mom in person and safety plan was completed. Mom states that patient biological father was an alcoholic and stated that she does not know if patient will relapse of not, but hope for the better she has realized this is a serious problem, her drinking. Mom states that 4 years ago they placed patient in a residential treatment center and she was there for 6 weeks, once being released she started back drinking. Mom said that her and her husband along with patient ex-husband are supportive and highly involved with the children to where the children will stay with them  or dad, if mom is drinking. Mom states that she has no issues or concerns for the children and that the children were not in the home when patient was highly intoxicated. Mom states that patient is connected with [facility] for Outpatient Monday, Wednesday, and Friday 6:30 pm-9:30 pm. Mom has removed all alcohol from patient home and stated that patient does not have any access to guns or weapons. Mom will monitor patient and stay with her once after DC.   The suicide prevention education provided includes the following: Suicide risk factors Suicide prevention and interventions National Suicide Hotline telephone number [facility] assessment telephone  number [facility] Emergency Assistance [place] and/or Residential Mobile Crisis Unit telephone number  Request made of family/significant other to: Remove weapons (e.g., guns, rifles, knives), all items previously/currently identified as safety concern.   Remove drugs/medications (over-the-counter, prescriptions, illicit drugs), all items previously/currently identified as a safety concern.  The family member/significant other verbalizes understanding of the suicide prevention education information provided.  The family member/significant other agrees to remove the items of safety concern listed above.  Sherre Lain 01/30/2023, 12:09 PM

## 2023-01-30 NOTE — Progress Notes (Signed)
Anne Stewart rates sleep as "Good". She slept for a total fo 9 hrs last night. Pt denies SI/HI/AVH. BP was elevated this a.m., will recheck. CIWA is a 0. Pt is asking about discharge date. Pt appears superficial and appears to be minimizing s/s. Pt remains safe.

## 2023-01-30 NOTE — Progress Notes (Signed)
Discharge Note:   AVS reviewed with Pt. Belongings returned. Pt denies SI/HI/AVH. Suicide safety plan completed and copy given. Survey completed. Pt escorted to lobby.

## 2023-01-30 NOTE — Progress Notes (Signed)
  [facility] Adult Case Management Discharge Plan :  Will you be returning to the same living situation after discharge:  Yes,  Patient will return back to her apartment  At discharge, do you have transportation home?: Yes,  Pt mom will pick her up  Do you have the ability to pay for your medications: Yes,  Patient has [employer]  Release of information consent forms completed and in the chart;  Patient's signature needed at discharge.  Patient to Follow up at:  Follow-up Information     Shona Needles, [facility]. Go on 02/02/2023.   Why: You have an appointment for therapy services on 02/02/23  at 4:00 pm, in person.  * Please call this provider to confirm your appt as soon as possible after discharge. Contact information: [address] [place], [place] [address]  Phone: [phone]        Chucky May, MD. Go on 02/12/2023.   Specialty: Psychiatry Why: You have an appointment for medication management services on 02/12/23 at 12:45 pm, in person. Contact information: [place] [address] [phone]                 Next level of care provider has access to [place] and Suicide Prevention discussed: Yes,  Olam Idler, mother, [phone]     Has patient been referred to the Quitline?: N/A patient is not a smoker  Patient has been referred for addiction treatment: Pt. refused referral. Patient stated that she has her own follow up appointments with [facility] for outpatient, has an addiction counselor, and a PCP who is supportive and helpful. Patient declined AA/NA information to be given stated she can find it herself.   Sherre Lain, LCSWA 01/30/2023, 12:28 PM

## 2023-01-30 NOTE — Discharge Summary (Signed)
Physician Discharge Summary Note  Patient:  Anne Stewart is an 37 y.o., female MRN:  [id number] DOB:  01-22-1986 Patient phone:  [phone] (home)  Patient address:   [place] [address],  Total Time spent with patient: 30 minutes  Date of Admission:  01/26/2023 Date of Discharge: 01/30/2023  Reason for Admission:   History of Present Illness: This is the first psychiatric admission, evaluation/treatment in this [facility] for this 37 year old Caucasian female with hx of alcohol use disorder, chronic. Admitted to the [facility] from the [facility] with complaint of suicidal ideations with plan to slit her wrists. Patient was at the time intoxicated. Her BAL at the ED was 413 which later dropped down to 288. Her UDS was also positive for THC. Patient is admitted to the [facility] under IVC by the police. Chart review indicated that patient was receiving mental health services on an outpatient basis with Dr. Toy Care. During this evaluation,  Anne Stewart reports,    "The police took me to the [facility] on Monday, 2 days ago. My mother called them. She told them that I was trying or thinking about hurting myself. That was not what I said. I do remember being asked by my step-father if I have ever thought about hurting myself, I replied yes. We were just having a conversation about why I was drinking too much alcohol. I have had problem with alcoholism. I drink too much to deal with my anxiety that I have had since my childhood. I drink two bottles of wine daily. I can go some days without drinking any alcohol. I have been drinking heavily for 15-18 years. I have been able to handle my drinking, but now, it is a bit of a problem. It is affecting my relationship withy my parents & my children's father. I have always had anxiety issues. The fears I have are also the reason I have for drinking too much. I have phobia for driving/eventual death. I don't think that I'm as depressed as I'm  anxious. I can be overly anxious. Dr. Toy Care had me on Naltrexone, propranolol, gabapentin & Trazodone. They do help, but I don't always take them. I'm also not consistent with my outpatient substance abuse treatments/meetings. I know mental illness ran in my family. My mother took Lithium back in the day for depression. She is no longer taking medicines. I have never attempted suicide".  Principal Problem: Alcohol-induced mood disorder with depressive symptoms Discharge Diagnoses: Principal Problem:   Alcohol-induced mood disorder with depressive symptoms (HCC) Active Problems:   Alcohol use disorder, severe, dependence ([place])   Past Psychiatric History: Please see H&P  Past Medical History: History reviewed. No pertinent past medical history. History reviewed. No pertinent surgical history. Family History: History reviewed. No pertinent family history. Family Psychiatric  History: Please see H&P Social History:  Social History   Substance and Sexual Activity  Alcohol Use Yes     Social History   Substance and Sexual Activity  Drug Use Not Currently    Social History   Socioeconomic History   Marital status: Single    Spouse name: Not on file   Number of children: Not on file   Years of education: Not on file   Highest education level: Not on file  Occupational History   Not on file  Tobacco Use   Smoking status: Never   Smokeless tobacco: Not on file  Substance and Sexual Activity   Alcohol use: Yes  Drug use: Not Currently   Sexual activity: Not on file  Other Topics Concern   Not on file  Social History Narrative   Not on file   Social Determinants of Health   Financial Resource Strain: Not on file  Food Insecurity: Not on file  Transportation Needs: Not on file  Physical Activity: Not on file  Stress: Not on file  Social Connections: Not on file    Hospital Course:  HOSPITAL COURSE:  During the patient's hospitalization, patient had extensive initial  psychiatric evaluation, and follow-up psychiatric evaluations every day.  Psychiatric diagnoses provided upon initial assessment: Depression with passive suicidal ideations and alcohol abuse.  Patient's psychiatric medications were adjusted on admission: Her home medications were restarted.  Patient was closely monitored for side effects.  During the hospitalization, other adjustments were made to the patient's psychiatric medication regimen: Patient denied any active depressive symptoms but does endorse alcohol abuse and wanted to be back on her scheduled medications that included gabapentin, Monopril, propranolol.  Patient was not on an SSRI.  Given her lack of obvious major depressive symptoms, patient was not started on an SSRI.  She did however stay on the detox protocol.  Patient's care was discussed during the interdisciplinary team meeting every day during the hospitalization.  The patient denied having side effects to prescribed psychiatric medication.  Gradually, patient started adjusting to milieu. The patient was evaluated each day by a clinical provider to ascertain response to treatment. Improvement was noted by the patient's report of decreasing symptoms, improved sleep and appetite, affect, medication tolerance, behavior, and participation in unit programming.  Patient was asked each day to complete a self inventory noting mood, mental status, pain, new symptoms, anxiety and concerns.    Symptoms were reported as significantly decreased or resolved completely by discharge.   On day of discharge, the patient reports that their mood is stable. The patient denied having suicidal thoughts for more than 48 hours prior to discharge.  Patient denies having homicidal thoughts.  Patient denies having auditory hallucinations.  Patient denies any visual hallucinations or other symptoms of psychosis. The patient was motivated to continue taking medication with a goal of continued improvement in  mental health.   The patient reports their target psychiatric symptoms of mood changes, alcohol abuse and irritability and anxiety responded well to the psychiatric medications, and the patient reports overall benefit other psychiatric hospitalization. Supportive psychotherapy was provided to the patient. The patient also participated in regular group therapy while hospitalized. Coping skills, problem solving as well as relaxation therapies were also part of the unit programming.  Labs were reviewed with the patient, and abnormal results were discussed with the patient.  The patient is able to verbalize their individual safety plan to this provider.  # It is recommended to the patient to continue psychiatric medications as prescribed, after discharge from the hospital.    # It is recommended to the patient to follow up with your outpatient psychiatric provider and PCP.  # It was discussed with the patient, the impact of alcohol, drugs, tobacco have been there overall psychiatric and medical wellbeing, and total abstinence from substance use was recommended the patient.ed.  # Prescriptions provided or sent directly to preferred pharmacy at discharge. Patient agreeable to plan. Given opportunity to ask questions. Appears to feel comfortable with discharge.    # In the event of worsening symptoms, the patient is instructed to call the crisis hotline, 911 and or go to the nearest ED  for appropriate evaluation and treatment of symptoms. To follow-up with primary care provider for other medical issues, concerns and or health care needs  # Patient was discharged home  with a plan to follow up as noted below.   Prior to discharge the patient was counseled extensively about outpatient substance abuse treatment and possibly inpatient rehab at least in the summer after school year ends.  The patient's mother was available for collateral information prior to discharge.  Mother endorses the fact the patient  has enrolled in an outpatient program 3 days a week and also has been seeking a sponsor.  Mother is quite comfortable with her coming home because she will monitor her and will help with her children.  Patient appears to have a safety plan in place and has outpatient therapy planned.  She was strongly encouraged to maintain sobriety.  Physical Findings: AIMS:  , ,  ,  ,    CIWA:  CIWA-Ar Total: 4 COWS:     Musculoskeletal: Strength & Muscle Tone: within normal limits Gait & Station: normal Patient leans: N/A   Psychiatric Specialty Exam:  Presentation  General Appearance:  Appropriate for Environment  Eye Contact: Fair  Speech: Clear and Coherent  Speech Volume: Normal  Handedness: Right   Mood and Affect  Mood: Anxious  Affect: Appropriate   Thought Process  Thought Processes: Linear  Descriptions of Associations:Intact  Orientation:Full (Time, Place and Person)  Thought Content:Logical  History of Schizophrenia/Schizoaffective disorder:No  Duration of Psychotic Symptoms:No data recorded Hallucinations:Hallucinations: None  Ideas of Reference:None  Suicidal Thoughts:Suicidal Thoughts: No  Homicidal Thoughts:Homicidal Thoughts: No   Sensorium  Memory: Immediate Fair; Remote Fair; Recent Fair  Judgment: Fair  Insight: Fair   Community education officer  Concentration: Fair  Attention Span: Fair  Recall: [employer] of Knowledge: Fair  Language: Fair   Psychomotor Activity  Psychomotor Activity: Psychomotor Activity: Normal   Assets  Assets: Communication Skills; Desire for Improvement; Housing; Leisure Time; Catering manager; Social Support   Sleep  Sleep: Sleep: Good    Physical Exam: Physical Exam Vitals and nursing note reviewed.  Constitutional:      Appearance: Normal appearance.  HENT:     Head: Normocephalic.  Musculoskeletal:     Cervical back: Normal range of motion and neck supple.   Neurological:     Mental Status: She is alert.  Psychiatric:        Mood and Affect: Mood normal.        Behavior: Behavior normal.        Thought Content: Thought content normal.        Judgment: Judgment normal.    Review of Systems  Psychiatric/Behavioral: Negative.    All other systems reviewed and are negative.  Blood pressure (!) 150/84, pulse (!) 101, temperature 97.7 F (36.5 C), temperature source Oral, resp. rate 16, height 5\' 7"  (1.702 m), weight 67.9 kg, last menstrual period 01/26/2023, SpO2 100 %. Body mass index is 23.43 kg/m.   Social History   Tobacco Use  Smoking Status Never  Smokeless Tobacco Not on file   Tobacco Cessation:  N/A, patient does not currently use tobacco products   Blood Alcohol level:  Lab Results  Component Value Date   ETH 288 (H) 01/26/2023   ETH 413 (HH) [id number]    Metabolic Disorder Labs:  No results found for: "HGBA1C", "MPG" No results found for: "PROLACTIN" No results found for: "CHOL", "TRIG", "HDL", "CHOLHDL", "VLDL", "[place]"  See Psychiatric Specialty Exam and  Suicide Risk Assessment completed by Attending Physician prior to discharge.  Discharge destination:  Home  Is patient on multiple antipsychotic therapies at discharge:  No   Has Patient had three or more failed trials of antipsychotic monotherapy by history:  No  Recommended Plan for Multiple Antipsychotic Therapies: NA  Discharge Instructions     Diet - low sodium heart healthy   Complete by: As directed    Increase activity slowly   Complete by: As directed       Allergies as of 01/30/2023   No Known Allergies      Medication List     TAKE these medications      Indication  Armodafinil 250 MG tablet Take 250 mg by mouth daily.  Indication: Shift-Work Sleep Disorder   busPIRone 10 MG tablet Commonly known as: BUSPAR Take 1 tablet (10 mg total) by mouth 3 (three) times daily.  Indication: Anxiety Disorder   gabapentin 300 MG  capsule Commonly known as: NEURONTIN Take 1 capsule (300 mg total) by mouth 4 (four) times daily - after meals and at bedtime. What changed: when to take this  Indication: Alcohol Withdrawal Syndrome   naltrexone 50 MG tablet Commonly known as: DEPADE Take 50 mg by mouth daily.  Indication: Abuse or Misuse of Alcohol   propranolol 10 MG tablet Commonly known as: INDERAL Take 1 tablet (10 mg total) by mouth 3 (three) times daily. What changed: when to take this  Indication: Feeling Anxious   traZODone 50 MG tablet Commonly known as: DESYREL Take 1 tablet (50 mg total) by mouth at bedtime as needed and may repeat dose one time if needed for sleep. What changed:  how much to take when to take this reasons to take this  Indication: [place], [facility]. Go on 02/02/2023.   Why: You have an appointment for therapy services on 02/02/23  at 4:00 pm, in person.  * Please call this provider to confirm your appt as soon as possible after discharge. Contact information: [address] [place], [place] [address]  Phone: [phone]        Chucky May, MD. Go on 02/12/2023.   Specialty: Psychiatry Why: You have an appointment for medication management services on 02/12/23 at 12:45 pm, in person. Contact information: [place]  [address] [phone]                 Follow-up recommendations:  Activity:  As tolerated. Do not drink. Followup with appointments.  Comments: The patient is being discharged after she has a safety plan and place and the mother has endorsed the fact that she will continue to ensure the patient follows through with outpatient care.  She has contracted for safety however potential for relapse is still possible given her long history of alcoholism.  Prognosis is fair to guarded.  Signed: Ranae Palms, MD 01/30/2023, 2:13 PM

## 2023-01-30 NOTE — [facility] Suicide Risk Assessment (Signed)
[facility] Discharge Suicide Risk Assessment   Principal Problem: Alcohol-induced mood disorder with depressive symptoms Discharge Diagnoses: Principal Problem:   Alcohol-induced mood disorder with depressive symptoms (HCC) Active Problems:   Alcohol use disorder, severe, dependence ([place])   Total Time spent with patient: 45 minutes  Musculoskeletal: Strength & Muscle Tone: within normal limits Gait & Station: normal Patient leans: N/A  Psychiatric Specialty Exam  Presentation  General Appearance:  Casual; Fairly Groomed  Eye Contact: Fair  Speech: Clear and Coherent; Normal Rate  Speech Volume: Normal  Handedness: Right   Mood and Affect  Mood: Anxious; Depressed  Duration of Depression Symptoms: No data recorded Affect: Congruent; Flat; Tearful; Depressed   Thought Process  Thought Processes: Coherent; Goal Directed; Linear  Descriptions of Associations:Intact  Orientation:Full (Time, Place and Person)  Thought Content:Logical  History of Schizophrenia/Schizoaffective disorder:No  Duration of Psychotic Symptoms:No data recorded Hallucinations:No data recorded Ideas of Reference:None  Suicidal Thoughts:No data recorded Homicidal Thoughts:No data recorded  Sensorium  Memory: Immediate Good; Recent Good; Remote Good  Judgment: Fair  Insight: Fair   Community education officer  Concentration: Fair  Attention Span: Fair  Recall: Good  Fund of Knowledge: Good  Language: Good   Psychomotor Activity  Psychomotor Activity:No data recorded  Assets  Assets: Communication Skills; Desire for Improvement; Financial Resources/Insurance; Housing; Social Support; Resilience; Physical Health   Sleep  Sleep:No data recorded  Physical Exam: Physical Exam Vitals and nursing note reviewed.  Constitutional:      Appearance: Normal appearance.  HENT:     Head: Normocephalic.  Neurological:     General: No focal deficit present.     Mental  Status: She is alert and oriented to person, place, and time. Mental status is at baseline.  Psychiatric:        Mood and Affect: Mood normal.    Review of Systems  Psychiatric/Behavioral: Negative.    All other systems reviewed and are negative.  Blood pressure (!) 150/84, pulse (!) 101, temperature 97.7 F (36.5 C), temperature source Oral, resp. rate 16, height 5\' 7"  (1.702 m), weight 67.9 kg, last menstrual period 01/26/2023, SpO2 100 %. Body mass index is 23.43 kg/m.  Mental Status Per Nursing Assessment::   On Admission:  NA  Demographic Factors:  Divorced or widowed, Caucasian, and Living alone  Loss Factors: NA  Historical Factors: Impulsivity and NA  Risk Reduction Factors:   Responsible for children under 23 years of age, Religious beliefs about death, Employed, Positive social support, and Positive therapeutic relationship  Continued Clinical Symptoms:  Bipolar Disorder:   Mixed State Alcohol/Substance Abuse/Dependencies  Cognitive Features That Contribute To Risk:  None    Suicide Risk:  Mild:  Suicidal ideation of limited frequency, intensity, duration, and specificity.  There are no identifiable plans, no associated intent, mild dysphoria and related symptoms, good self-control (both objective and subjective assessment), few other risk factors, and identifiable protective factors, including available and accessible social support.   Follow-up Information     Shona Need[facility], [facility]. Go on 02/02/2023.   Why: You have an appointment for therapy services on 02/02/23  at 2:00 pm, in person.  * Please call this provider to confirm your appt as soon as possible after discharge. Contact information: [address] [place], [place] [address]  Phone: [phone]        Chucky May, MD. Go on 02/12/2023.   Specialty: Psychiatry Why: You have an appointment for medication management services on 02/12/23 at 12:45 pm,  in person. Contact  information: [place] [address] [phone]                 Plan Of Care/Follow-up recommendations:  Activity:  As tolerated  Ranae Palms, MD 01/30/2023, 12:18 PM

## 2024-03-29 ENCOUNTER — Encounter (HOSPITAL_BASED_OUTPATIENT_CLINIC_OR_DEPARTMENT_OTHER): Payer: Self-pay | Admitting: *Deleted

## 2024-03-29 ENCOUNTER — Emergency Department (HOSPITAL_BASED_OUTPATIENT_CLINIC_OR_DEPARTMENT_OTHER)
Admission: EM | Admit: 2024-03-29 | Discharge: 2024-03-29 | Disposition: A | Discharge status: Home / Self Care | Payer: MEDICAID | Attending: Emergency Medicine | Admitting: Emergency Medicine

## 2024-03-29 ENCOUNTER — Other Ambulatory Visit: Payer: Self-pay

## 2024-03-29 DIAGNOSIS — R220 Localized swelling, mass and lump, head: Secondary | ICD-10-CM | POA: Diagnosis present

## 2024-03-29 DIAGNOSIS — D72829 Elevated white blood cell count, unspecified: Secondary | ICD-10-CM | POA: Insufficient documentation

## 2024-03-29 DIAGNOSIS — L03211 Cellulitis of face: Secondary | ICD-10-CM | POA: Diagnosis not present

## 2024-03-29 LAB — COMPREHENSIVE METABOLIC PANEL WITH GFR
ALT: 16 U/L (ref 0–44)
AST: 19 U/L (ref 15–41)
Albumin: 4.7 g/dL (ref 3.5–5.0)
Alkaline Phosphatase: 133 U/L — ABNORMAL HIGH (ref 38–126)
Anion gap: 19 — ABNORMAL HIGH (ref 5–15)
BUN: 6 mg/dL (ref 6–20)
CO2: 22 mmol/L (ref 22–32)
Calcium: 9.6 mg/dL (ref 8.9–10.3)
Chloride: 101 mmol/L (ref 98–111)
Creatinine, Ser: 0.66 mg/dL (ref 0.44–1.00)
GFR, Estimated: >60 mL/min (ref >60)
Glucose, Bld: 109 mg/dL — ABNORMAL HIGH (ref 70–99)
Potassium: 3.6 mmol/L (ref 3.5–5.1)
Sodium: 141 mmol/L (ref 135–145)
Total Bilirubin: 0.4 mg/dL (ref 0.0–1.2)
Total Protein: 7.9 g/dL (ref 6.5–8.1)

## 2024-03-29 LAB — CBC WITH DIFFERENTIAL/PLATELET
Abs Immature Granulocytes: 0.05 10*3/uL (ref 0.00–0.07)
Basophils Absolute: 0.1 10*3/uL (ref 0.0–0.1)
Basophils Relative: 0 %
Eosinophils Absolute: 0.1 10*3/uL (ref 0.0–0.5)
Eosinophils Relative: 0 %
HCT: 38.5 % (ref 36.0–46.0)
Hemoglobin: 13.1 g/dL (ref 12.0–15.0)
Immature Granulocytes: 0 %
Lymphocytes Relative: 16 %
Lymphs Abs: 2.6 10*3/uL (ref 0.7–4.0)
MCH: 31.1 pg (ref 26.0–34.0)
MCHC: 34 g/dL (ref 30.0–36.0)
MCV: 91.4 fL (ref 80.0–100.0)
Monocytes Absolute: 1.3 10*3/uL — ABNORMAL HIGH (ref 0.1–1.0)
Monocytes Relative: 8 %
Neutro Abs: 12.1 10*3/uL — ABNORMAL HIGH (ref 1.7–7.7)
Neutrophils Relative %: 76 %
Platelets: 264 10*3/uL (ref 150–400)
RBC: 4.21 MIL/uL (ref 3.87–5.11)
RDW: 14.1 % (ref 11.5–15.5)
WBC: 16.3 10*3/uL — ABNORMAL HIGH (ref 4.0–10.5)
nRBC: 0 % (ref 0.0–0.2)

## 2024-03-29 LAB — LACTIC ACID, PLASMA
Lactic Acid, Venous: 2.2 mmol/L (ref 0.5–1.9)
Lactic Acid, Venous: 2.7 mmol/L (ref 0.5–1.9)
Lactic Acid, Venous: 2.7 mmol/L (ref 0.5–1.9)
Lactic Acid, Venous: 2.4 mmol/L (ref 0.5–1.9)

## 2024-03-29 LAB — HCG, SERUM, QUALITATIVE: Preg, Serum: NEGATIVE

## 2024-03-29 MED ORDER — DOXYCYCLINE HYCLATE 100 MG PO CAPS: 100.0000 mg | ORAL_CAPSULE | Freq: Two times a day (BID) | ORAL | 0 refills | Status: AC

## 2024-03-29 MED ORDER — LACTATED RINGERS IV BOLUS
1000.0000 mL | Freq: Once | INTRAVENOUS | Status: AC
  Administered 2024-03-29: 1000 mL via INTRAVENOUS

## 2024-03-29 MED ORDER — ACETAMINOPHEN 500 MG PO TABS
1000.0000 mg | ORAL_TABLET | Freq: Once | ORAL | Status: DC
  Filled 2024-03-29: qty 2

## 2024-03-29 MED ORDER — VANCOMYCIN HCL IN DEXTROSE 1-5 GM/200ML-% IV SOLN
1000.0000 mg | Freq: Once | INTRAVENOUS | Status: AC
  Administered 2024-03-29: 1000 mg via INTRAVENOUS
  Filled 2024-03-29: qty 200

## 2024-03-29 MED ORDER — AMOXICILLIN-POT CLAVULANATE 875-125 MG PO TABS: 1.0000 | ORAL_TABLET | Freq: Two times a day (BID) | ORAL | 0 refills | Status: DC

## 2024-03-29 NOTE — ED Provider Notes (Signed)
 [facility] AT [facility] Provider Note   CSN: [id number] Arrival date & time: 03/29/24  2130     History  Chief Complaint  Patient presents with   Facial Swelling    Anne Stewart is a 38 y.o. female.  HPI   38 year old female with medical history significant for alcohol use disorder prescribed naltrexone  who presents to the emergency department with left-sided facial swelling.  The patient denies any dental pain or swelling around her teeth.  She states that she had a lesion along the left corner of her mouth that became erythematous and painful.  She admitted in triage to a habit of occasionally picking her face.  She denies any fevers or chills.  She states that the left side of her face has swollen up significantly with erythema tracking up her cheek with associated swelling periorbitally.  She denies any pain with extraocular movements.  Home Medications Prior to Admission medications   Medication Sig Start Date End Date Taking? Authorizing Provider  amoxicillin-clavulanate (AUGMENTIN) 875-125 MG tablet Take 1 tablet by mouth every 12 (twelve) hours. 03/29/24  Yes Rosealee Concha, MD  amphetamine-dextroamphetamine (ADDERALL) 20 MG tablet Take 20 mg by mouth 3 (three) times daily as needed. 03/22/24  Yes [provider]  doxycycline (VIBRAMYCIN) 100 MG capsule Take 1 capsule (100 mg total) by mouth 2 (two) times daily for 7 days. 03/29/24 04/05/24 Yes Rosealee Concha, MD  gabapentin  (NEURONTIN ) 800 MG tablet Take 800 mg by mouth 4 (four) times daily. 02/07/24  Yes [provider]  Armodafinil  250 MG tablet Take 250 mg by mouth daily.    [provider]  busPIRone  (BUSPAR ) 10 MG tablet Take 1 tablet (10 mg total) by mouth 3 (three) times daily. 01/30/23   Goli, Veeraindar, MD  gabapentin  (NEURONTIN ) 300 MG capsule Take 1 capsule (300 mg total) by mouth 4 (four) times daily - after meals and at bedtime. 01/30/23   Goli, Veeraindar, MD   naltrexone  (DEPADE) 50 MG tablet Take 50 mg by mouth daily. 11/05/22   [provider]  propranolol  (INDERAL ) 10 MG tablet Take 1 tablet (10 mg total) by mouth 3 (three) times daily. 01/30/23   Goli, Veeraindar, MD  traZODone  (DESYREL ) 50 MG tablet Take 1 tablet (50 mg total) by mouth at bedtime as needed and may repeat dose one time if needed for sleep. 01/30/23   Goli, Veeraindar, MD      Allergies    Patient has no known allergies.    Review of Systems   Review of Systems  All other systems reviewed and are negative.   Physical Exam Updated Vital Signs BP (!) 143/97 (BP Location: Right Arm)   Pulse 74   Temp 98.2 F (36.8 C) (Oral)   Resp 14   LMP 03/26/2024   SpO2 97%  Physical Exam Vitals and nursing note reviewed.  Constitutional:      General: She is not in acute distress.    Appearance: She is well-developed.  HENT:     Head: Normocephalic and atraumatic.     Comments: Extensive left sided facial facial cellulitis and edema, erythema worse in a punctate area around the left upper lip that is TTP. No tongue elevation, no neck swelling, no clear palpable fluctuance Eyes:     Conjunctiva/sclera: Conjunctivae normal.     Comments: No pain with EOMS  Cardiovascular:     Rate and Rhythm: Normal rate and regular rhythm.     Heart sounds: No murmur  heard. Pulmonary:     Effort: Pulmonary effort is normal. No respiratory distress.     Breath sounds: Normal breath sounds.  Abdominal:     Palpations: Abdomen is soft.     Tenderness: There is no abdominal tenderness.  Musculoskeletal:        General: No swelling.     Cervical back: Neck supple.  Skin:    General: Skin is warm and dry.     Capillary Refill: Capillary refill takes less than 2 seconds.  Neurological:     Mental Status: She is alert.  Psychiatric:        Mood and Affect: Mood normal.      ED Results / Procedures / Treatments   Labs (all labs ordered are listed, but only abnormal results are  displayed) Labs Reviewed  LACTIC ACID, PLASMA - Abnormal; Notable for the following components:      Result Value   Lactic Acid, Venous 2.2 (*)    All other components within normal limits  LACTIC ACID, PLASMA - Abnormal; Notable for the following components:   Lactic Acid, Venous 2.7 (*)    All other components within normal limits  COMPREHENSIVE METABOLIC PANEL WITH GFR - Abnormal; Notable for the following components:   Glucose, Bld 109 (*)    Alkaline Phosphatase 133 (*)    Anion gap 19 (*)    All other components within normal limits  CBC WITH DIFFERENTIAL/PLATELET - Abnormal; Notable for the following components:   WBC 16.3 (*)    Neutro Abs 12.1 (*)    Monocytes Absolute 1.3 (*)    All other components within normal limits  LACTIC ACID, PLASMA - Abnormal; Notable for the following components:   Lactic Acid, Venous 2.7 (*)    All other components within normal limits  LACTIC ACID, PLASMA - Abnormal; Notable for the following components:   Lactic Acid, Venous 2.4 (*)    All other components within normal limits  CULTURE, BLOOD (ROUTINE X 2)  CULTURE, BLOOD (ROUTINE X 2)  HCG, SERUM, QUALITATIVE    EKG None  Radiology No results found.  Procedures .Ultrasound ED Soft Tissue  Date/Time: 03/29/2024 7:53 AM  Performed by: Rosealee Concha, MD Authorized by: Rosealee Concha, MD   Procedure details:    Indications: localization of abscess and evaluate for cellulitis     Transverse view:  Visualized   Longitudinal view:  Visualized   Images: not archived   Location:    Location: face     Side:  Left Findings:     cellulitis present Comments:     Cobblestoning of the soft tissues of the face consistent with facial cellulitis, area of phlegmon near the left upper lip that could reflect developing abscess     Medications Ordered in ED Medications  acetaminophen  (TYLENOL ) tablet 1,000 mg (0 mg Oral Hold 03/29/24 1126)  lactated ringers  bolus 1,000 mL ( Intravenous  Stopped 03/29/24 0906)  vancomycin  (VANCOCIN ) IVPB 1000 mg/200 mL premix (0 mg Intravenous Stopped 03/29/24 0906)  lactated ringers  bolus 1,000 mL ( Intravenous Stopped 03/29/24 1227)    ED Course/ Medical Decision Making/ A&P                                 Medical Decision Making Amount and/or Complexity of Data Reviewed Labs: ordered.  Risk OTC drugs. Prescription drug management.    38 year old female with medical history significant for alcohol use disorder  prescribed naltrexone  who presents to the emergency department with left-sided facial swelling.  The patient denies any dental pain or swelling around her teeth.  She states that she had a lesion along the left corner of her mouth that became erythematous and painful.  She admitted in triage to a habit of occasionally picking her face.  She denies any fevers or chills.  She states that the left side of her face has swollen up significantly with erythema tracking up her cheek with associated swelling periorbitally.  She denies any pain with extraocular movements.  On arrival, the patient was afebrile, not tachycardic or tachypneic, heart rate 91, BP 134/115, saturating 97% on room air.  Physical exam revealed left-sided facial cellulitis.  Facial edema extends from the mouth up to the periorbital region.  No tongue swelling or elevation to suggest Ludwig angina, no neck swelling, no difficulty with range of motion of the neck, low concern for RPA.  Patient denies any dental pain, no swelling of the gums.  Symptoms appear to have started with a lesion close to the left upper lip that is tender and mildly erythematous.  Ultrasound was performed which revealed developing phlegmon, possible developing abscess.  No pain with extraocular movements to suggest orbital cellulitis.  The patient was offered I&D but adamantly refused.  She was also offered CT imaging of the face but also refused citing cost.  Labs: CBC with leukocytosis to 16.3, no  anemia hemoglobin 13.1, CMP without significant electrolyte abnormality, bicarbonate 22 with a elevation in anion gap to 19, lactic acidosis noted to 2.2.  The patient was administered an IV fluid bolus as well as IV vancomycin.  The patient was offered admission for monitoring but also refused admission.  Will discharge her on a course of oral Augmentin and doxycycline given resistance to Bactrim.  Lactic acid was initially climbing but subsequently noted to be downtrending after IV fluids.  Patient not meeting SIRS criteria, afebrile, heart rates in the 70s prior to fluid resuscitation, does have a leukocytosis.  Extensive return precautions were communicated with the patient.  After discussing the risks and benefits of further diagnostic testing and further monitoring inpatient, the patient elected to proceed with a trial of outpatient oral antibiotics.    Final Clinical Impression(s) / ED Diagnoses Final diagnoses:  Facial cellulitis    Rx / DC Orders ED Discharge Orders          Ordered    doxycycline (VIBRAMYCIN) 100 MG capsule  2 times daily        03/29/24 0751    amoxicillin-clavulanate (AUGMENTIN) 875-125 MG tablet  Every 12 hours        03/29/24 0751              Rosealee Concha, MD 03/29/24 1507

## 2024-03-29 NOTE — Discharge Instructions (Addendum)
 You were seen in the emergency department for facial cellulitis.  You have a developing fluid collection in your left upper lip and were offered attempt at incision and drainage but you declined.  A CT scan was also offered but also declined.  You are given a dose of IV antibiotics in the emergency department and are being discharged on oral antibiotics.  Please return to the emergency department in the event of any severe worsening swelling, fever, tongue swelling, neck swelling.

## 2024-03-29 NOTE — ED Notes (Signed)
 Urine cup at the bedside, pt advised that urine sample is needed.

## 2024-03-29 NOTE — Progress Notes (Signed)
 ED Pharmacy Antibiotic Sign Off An antibiotic consult was received from an ED provider for vancomycin per pharmacy dosing for cellulitis. A chart review was completed to assess appropriateness.   The following one time order(s) were placed:  Vancomycin 1g IV x 1 then will be discharged on PO antibiotics  Further antibiotic and/or antibiotic pharmacy consults should be ordered by the admitting provider if indicated.   Thank you for allowing pharmacy to be a part of this patient's care.   Genoveva Singleton, Kathlene Paradise, [facility]  Clinical Pharmacist 03/29/24 7:52 AM

## 2024-03-29 NOTE — ED Triage Notes (Signed)
 Pt is here for facial swelling on left side of her face that began on Saturday and has been getting worse.  Pt denies any dental pain with this but does have some sores on her face, she states that she has a habit of picking at her face. No fever with this and no sob.

## 2024-04-03 LAB — CULTURE, BLOOD (ROUTINE X 2)
Culture: NO GROWTH
Special Requests: ADEQUATE

## 2024-04-28 ENCOUNTER — Encounter (HOSPITAL_COMMUNITY): Payer: Self-pay

## 2024-04-28 ENCOUNTER — Other Ambulatory Visit: Payer: Self-pay

## 2024-04-28 ENCOUNTER — Inpatient Hospital Stay (HOSPITAL_COMMUNITY)
Admission: EM | Admit: 2024-04-28 | Discharge: 2024-04-29 | DRG: 897 | Disposition: A | Discharge status: Home / Self Care | Payer: MEDICAID | Attending: Internal Medicine | Admitting: Internal Medicine

## 2024-04-28 DIAGNOSIS — F32A Depression, unspecified: Secondary | ICD-10-CM | POA: Diagnosis present

## 2024-04-28 DIAGNOSIS — N179 Acute kidney failure, unspecified: Secondary | ICD-10-CM | POA: Diagnosis present

## 2024-04-28 DIAGNOSIS — R000 Tachycardia, unspecified: Secondary | ICD-10-CM | POA: Diagnosis present

## 2024-04-28 DIAGNOSIS — F10930 Alcohol use, unspecified with withdrawal, uncomplicated: Secondary | ICD-10-CM | POA: Diagnosis not present

## 2024-04-28 DIAGNOSIS — Z79899 Other long term (current) drug therapy: Secondary | ICD-10-CM | POA: Diagnosis not present

## 2024-04-28 DIAGNOSIS — I10 Essential (primary) hypertension: Secondary | ICD-10-CM | POA: Diagnosis present

## 2024-04-28 DIAGNOSIS — E860 Dehydration: Secondary | ICD-10-CM | POA: Diagnosis present

## 2024-04-28 DIAGNOSIS — D72829 Elevated white blood cell count, unspecified: Secondary | ICD-10-CM | POA: Diagnosis present

## 2024-04-28 DIAGNOSIS — Z91148 Patient's other noncompliance with medication regimen for other reason: Secondary | ICD-10-CM | POA: Diagnosis not present

## 2024-04-28 DIAGNOSIS — F411 Generalized anxiety disorder: Secondary | ICD-10-CM | POA: Diagnosis present

## 2024-04-28 DIAGNOSIS — F10230 Alcohol dependence with withdrawal, uncomplicated: Secondary | ICD-10-CM | POA: Diagnosis present

## 2024-04-28 DIAGNOSIS — R112 Nausea with vomiting, unspecified: Secondary | ICD-10-CM | POA: Diagnosis present

## 2024-04-28 DIAGNOSIS — F39 Unspecified mood [affective] disorder: Secondary | ICD-10-CM | POA: Diagnosis present

## 2024-04-28 DIAGNOSIS — F10939 Alcohol use, unspecified with withdrawal, unspecified: Secondary | ICD-10-CM | POA: Diagnosis present

## 2024-04-28 HISTORY — DX: Generalized anxiety disorder: F41.1

## 2024-04-28 HISTORY — DX: Other psychoactive substance use, unspecified, uncomplicated: F19.90

## 2024-04-28 HISTORY — DX: Alcohol use, unspecified, uncomplicated: F10.90

## 2024-04-28 LAB — COMPREHENSIVE METABOLIC PANEL WITH GFR
ALT: 22 U/L (ref 0–44)
ALT: 22 U/L (ref 0–44)
AST: 43 U/L — ABNORMAL HIGH (ref 15–41)
AST: 33 U/L (ref 15–41)
Albumin: 4.7 g/dL (ref 3.5–5.0)
Albumin: 4.7 g/dL (ref 3.5–5.0)
Alkaline Phosphatase: 97 U/L (ref 38–126)
Alkaline Phosphatase: 98 U/L (ref 38–126)
Anion gap: 16 — ABNORMAL HIGH (ref 5–15)
Anion gap: 18 — ABNORMAL HIGH (ref 5–15)
BUN: 12 mg/dL (ref 6–20)
BUN: 8 mg/dL (ref 6–20)
CO2: 20 mmol/L — ABNORMAL LOW (ref 22–32)
CO2: 24 mmol/L (ref 22–32)
Calcium: 8.8 mg/dL — ABNORMAL LOW (ref 8.9–10.3)
Calcium: 9.3 mg/dL (ref 8.9–10.3)
Chloride: 96 mmol/L — ABNORMAL LOW (ref 98–111)
Chloride: 95 mmol/L — ABNORMAL LOW (ref 98–111)
Creatinine, Ser: 1.2 mg/dL — ABNORMAL HIGH (ref 0.44–1.00)
Creatinine, Ser: 1 mg/dL (ref 0.44–1.00)
GFR, Estimated: 59 mL/min — ABNORMAL LOW (ref >60)
GFR, Estimated: >60 mL/min (ref >60)
Glucose, Bld: 164 mg/dL — ABNORMAL HIGH (ref 70–99)
Glucose, Bld: 118 mg/dL — ABNORMAL HIGH (ref 70–99)
Potassium: 4.9 mmol/L (ref 3.5–5.1)
Potassium: 3.6 mmol/L (ref 3.5–5.1)
Sodium: 132 mmol/L — ABNORMAL LOW (ref 135–145)
Sodium: 137 mmol/L (ref 135–145)
Total Bilirubin: 1.4 mg/dL — ABNORMAL HIGH (ref 0.0–1.2)
Total Bilirubin: 0.9 mg/dL (ref 0.0–1.2)
Total Protein: 7.8 g/dL (ref 6.5–8.1)
Total Protein: 7.8 g/dL (ref 6.5–8.1)

## 2024-04-28 LAB — CBC WITH DIFFERENTIAL/PLATELET
Abs Immature Granulocytes: 0.08 10*3/uL — ABNORMAL HIGH (ref 0.00–0.07)
Basophils Absolute: 0 10*3/uL (ref 0.0–0.1)
Basophils Relative: 0 %
Eosinophils Absolute: 0 10*3/uL (ref 0.0–0.5)
Eosinophils Relative: 0 %
HCT: 32.5 % — ABNORMAL LOW (ref 36.0–46.0)
Hemoglobin: 11.1 g/dL — ABNORMAL LOW (ref 12.0–15.0)
Immature Granulocytes: 1 %
Lymphocytes Relative: 5 %
Lymphs Abs: 0.7 10*3/uL (ref 0.7–4.0)
MCH: 30.5 pg (ref 26.0–34.0)
MCHC: 34.2 g/dL (ref 30.0–36.0)
MCV: 89.3 fL (ref 80.0–100.0)
Monocytes Absolute: 0.9 10*3/uL (ref 0.1–1.0)
Monocytes Relative: 7 %
Neutro Abs: 12.6 10*3/uL — ABNORMAL HIGH (ref 1.7–7.7)
Neutrophils Relative %: 87 %
Platelets: 371 10*3/uL (ref 150–400)
RBC: 3.64 MIL/uL — ABNORMAL LOW (ref 3.87–5.11)
RDW: 14.8 % (ref 11.5–15.5)
WBC: 14.3 10*3/uL — ABNORMAL HIGH (ref 4.0–10.5)
nRBC: 0 % (ref 0.0–0.2)

## 2024-04-28 LAB — FOLATE: Folate: 21.6 ng/mL (ref >5.9)

## 2024-04-28 LAB — LIPASE, BLOOD: Lipase: 54 U/L — ABNORMAL HIGH (ref 11–51)

## 2024-04-28 LAB — VITAMIN B12: Vitamin B-12: 402 pg/mL (ref 180–914)

## 2024-04-28 LAB — MRSA NEXT GEN BY PCR, NASAL: MRSA by PCR Next Gen: DETECTED — AB

## 2024-04-28 LAB — ETHANOL: Alcohol, Ethyl (B): <15 mg/dL (ref <15)

## 2024-04-28 MED ORDER — LORAZEPAM 1 MG PO TABS: 1.0000 mg | ORAL_TABLET | ORAL | Status: DC | PRN

## 2024-04-28 MED ORDER — METOCLOPRAMIDE HCL 5 MG/ML IJ SOLN
10.0000 mg | Freq: Once | INTRAMUSCULAR | Status: AC
  Administered 2024-04-28: 10 mg via INTRAVENOUS
  Filled 2024-04-28: qty 2

## 2024-04-28 MED ORDER — ADULT MULTIVITAMIN W/MINERALS CH
1.0000 | ORAL_TABLET | Freq: Every day | ORAL | Status: DC
  Administered 2024-04-28 – 2024-04-29 (×2): 1 via ORAL
  Filled 2024-04-28 (×2): qty 1

## 2024-04-28 MED ORDER — LORAZEPAM 2 MG/ML IJ SOLN
1.0000 mg | Freq: Once | INTRAMUSCULAR | Status: AC
  Administered 2024-04-28: 1 mg via INTRAVENOUS
  Filled 2024-04-28: qty 1

## 2024-04-28 MED ORDER — LACTATED RINGERS IV BOLUS
1000.0000 mL | Freq: Once | INTRAVENOUS | Status: AC
  Administered 2024-04-28: 1000 mL via INTRAVENOUS

## 2024-04-28 MED ORDER — MUPIROCIN 2 % EX OINT
1.0000 | TOPICAL_OINTMENT | Freq: Two times a day (BID) | CUTANEOUS | Status: DC
  Administered 2024-04-29 (×2): 1 via NASAL
  Filled 2024-04-28: qty 22

## 2024-04-28 MED ORDER — THIAMINE HCL 100 MG/ML IJ SOLN
100.0000 mg | Freq: Every day | INTRAMUSCULAR | Status: DC
  Filled 2024-04-28: qty 2

## 2024-04-28 MED ORDER — CHLORHEXIDINE GLUCONATE CLOTH 2 % EX PADS: 6.0000 | MEDICATED_PAD | Freq: Every day | CUTANEOUS | Status: DC

## 2024-04-28 MED ORDER — LORAZEPAM 2 MG/ML IJ SOLN
1.0000 mg | INTRAMUSCULAR | Status: DC | PRN
  Administered 2024-04-28 (×2): 2 mg via INTRAVENOUS
  Administered 2024-04-29 (×2): 4 mg via INTRAVENOUS
  Filled 2024-04-28 (×2): qty 2
  Filled 2024-04-28 (×2): qty 1

## 2024-04-28 MED ORDER — CHLORHEXIDINE GLUCONATE CLOTH 2 % EX PADS
6.0000 | MEDICATED_PAD | Freq: Every day | CUTANEOUS | Status: DC
  Administered 2024-04-28: 6 via TOPICAL

## 2024-04-28 MED ORDER — FOLIC ACID 1 MG PO TABS
1.0000 mg | ORAL_TABLET | Freq: Every day | ORAL | Status: DC
  Administered 2024-04-28 – 2024-04-29 (×2): 1 mg via ORAL
  Filled 2024-04-28 (×2): qty 1

## 2024-04-28 MED ORDER — THIAMINE MONONITRATE 100 MG PO TABS
100.0000 mg | ORAL_TABLET | Freq: Every day | ORAL | Status: DC
  Administered 2024-04-28 – 2024-04-29 (×2): 100 mg via ORAL
  Filled 2024-04-28 (×2): qty 1

## 2024-04-28 NOTE — ED Provider Notes (Signed)
 [place] EMERGENCY DEPARTMENT AT [facility] Provider Note   CSN: [id number] Arrival date & time: 04/28/24  1330     Patient presents with: Withdrawal (Alcohol W/D. Last drink 6/26 at 12pm, 2 bottles of wine.)   Anne Stewart is a 38 y.o. female.   Patient is a 37 year old female with a history of alcohol abuse, anxiety and depression who is presenting today due to concern for alcohol withdrawal.  She reports that she normally drinks at least a bottle of wine per day and last had something to drink yesterday.  Since last night she has started having nausea and vomiting.  She is having some mild abdominal pain.  She is feeling shaky and feeling like withdrawal she has had in the past.  She denies any prior history of seizures.  The history is provided by the patient and medical records.       Prior to Admission medications   Medication Sig Start Date End Date Taking? Authorizing Provider  amoxicillin -clavulanate (AUGMENTIN ) 875-125 MG tablet Take 1 tablet by mouth every 12 (twelve) hours. 03/29/24   Jerrol Agent, MD  amphetamine-dextroamphetamine (ADDERALL) 20 MG tablet Take 20 mg by mouth 3 (three) times daily as needed. 03/22/24   [provider]  Armodafinil  250 MG tablet Take 250 mg by mouth daily.    [provider]  busPIRone  (BUSPAR ) 10 MG tablet Take 1 tablet (10 mg total) by mouth 3 (three) times daily. 01/30/23   Goli, Veeraindar, MD  gabapentin  (NEURONTIN ) 300 MG capsule Take 1 capsule (300 mg total) by mouth 4 (four) times daily - after meals and at bedtime. 01/30/23   Goli, Veeraindar, MD  gabapentin  (NEURONTIN ) 800 MG tablet Take 800 mg by mouth 4 (four) times daily. 02/07/24   [provider]  naltrexone  (DEPADE) 50 MG tablet Take 50 mg by mouth daily. 11/05/22   [provider]  propranolol  (INDERAL ) 10 MG tablet Take 1 tablet (10 mg total) by mouth 3 (three) times daily. 01/30/23   Goli, Veeraindar, MD  traZODone  (DESYREL )  50 MG tablet Take 1 tablet (50 mg total) by mouth at bedtime as needed and may repeat dose one time if needed for sleep. 01/30/23   Goli, Veeraindar, MD    Allergies: Patient has no known allergies.    Review of Systems  Updated Vital Signs BP (!) 156/102   Pulse (!) 123   Temp 98.1 F (36.7 C) (Oral)   Resp 20   LMP 03/26/2024   SpO2 100%   Physical Exam Vitals and nursing note reviewed.  Constitutional:      General: She is not in acute distress.    Appearance: She is well-developed.  HENT:     Head: Normocephalic and atraumatic.     Mouth/Throat:     Mouth: Mucous membranes are dry.   Eyes:     Pupils: Pupils are equal, round, and reactive to light.    Cardiovascular:     Rate and Rhythm: Regular rhythm. Tachycardia present.     Heart sounds: Normal heart sounds. No murmur heard.    No friction rub.  Pulmonary:     Effort: Pulmonary effort is normal.     Breath sounds: Normal breath sounds. No wheezing or rales.  Abdominal:     General: Bowel sounds are normal. There is no distension.     Palpations: Abdomen is soft.     Tenderness: There is no abdominal tenderness. There is no guarding or rebound.   Musculoskeletal:  General: No tenderness. Normal range of motion.     Comments: No edema   Skin:    General: Skin is warm and dry.     Findings: No rash.   Neurological:     Mental Status: She is alert and oriented to person, place, and time.     Cranial Nerves: No cranial nerve deficit.     Comments: Mild tremulousness  Psychiatric:        Behavior: Behavior normal.     (all labs ordered are listed, but only abnormal results are displayed) Labs Reviewed  CBC WITH DIFFERENTIAL/PLATELET - Abnormal; Notable for the following components:      Result Value   WBC 14.3 (*)    RBC 3.64 (*)    Hemoglobin 11.1 (*)    HCT 32.5 (*)    Neutro Abs 12.6 (*)    Abs Immature Granulocytes 0.08 (*)    All other components within normal limits  COMPREHENSIVE  METABOLIC PANEL WITH GFR - Abnormal; Notable for the following components:   Sodium 132 (*)    Chloride 96 (*)    CO2 20 (*)    Glucose, Bld 164 (*)    Creatinine, Ser 1.20 (*)    Calcium 8.8 (*)    AST 43 (*)    Total Bilirubin 1.4 (*)    GFR, Estimated 59 (*)    Anion gap 16 (*)    All other components within normal limits  LIPASE, BLOOD - Abnormal; Notable for the following components:   Lipase 54 (*)    All other components within normal limits    EKG: None  Radiology: No results found.   Procedures   Medications Ordered in the ED  LORazepam  (ATIVAN ) tablet 1-4 mg ( Oral See Alternative 04/28/24 1608)    Or  LORazepam  (ATIVAN ) injection 1-4 mg (2 mg Intravenous Given 04/28/24 1608)  thiamine  (VITAMIN B1) tablet 100 mg (100 mg Oral Given 04/28/24 1440)    Or  thiamine  (VITAMIN B1) injection 100 mg ( Intravenous See Alternative 04/28/24 1440)  folic acid (FOLVITE) tablet 1 mg (1 mg Oral Given 04/28/24 1440)  multivitamin with minerals tablet 1 tablet (1 tablet Oral Given 04/28/24 1440)  metoCLOPramide (REGLAN) injection 10 mg (has no administration in time range)  lactated ringers  bolus 1,000 mL (1,000 mLs Intravenous New Bag/Given 04/28/24 1433)  LORazepam  (ATIVAN ) injection 1 mg (1 mg Intravenous Given 04/28/24 1433)                                    Medical Decision Making Amount and/or Complexity of Data Reviewed Labs: ordered. Decision-making details documented in ED Course.  Risk OTC drugs. Prescription drug management.   Pt with multiple medical problems and comorbidities and presenting today with a complaint that caries a high risk for morbidity and mortality.  Here today due to concern for alcohol withdrawal.  Patient's last drink was yesterday.  She is tachycardic, hypertensive and mildly tremulous.  She also has signs concerning for possible dehydration.  Patient given IV fluids, benzos.  Labs are pending.  4:54 PM I independently interpreted patient's  labs and CBC today with a leukocytosis of 14 with normal hemoglobin, CMP with mild AKI today with creatinine of 1.2 from her baseline of 0.8 and anion gap of 16.  Lipase only minimally elevated at 54.  Patient given IV fluids and Ativan .  4:54 PM Patient is still receiving  fluids and currently is still complaining of some nausea.  She was given Reglan.  Still tachycardic at 114.  Will continue to give supportive care.        Final diagnoses:  Alcohol withdrawal syndrome without complication (HCC)  Dehydration  AKI (acute kidney injury) [facility])    ED Discharge Orders     None          Doretha Folks, MD 04/28/24 1654

## 2024-04-28 NOTE — ED Provider Notes (Signed)
 I took over this patient at 5 pm   Physical Exam  BP (!) 139/108 (BP Location: Left Arm)   Pulse (!) 114   Temp 98.1 F (36.7 C) (Oral)   Resp 17   LMP 03/26/2024   SpO2 100%   Physical Exam Vitals reviewed.  Constitutional:      Appearance: Normal appearance. She is ill-appearing.  HENT:     Head: Normocephalic.   Cardiovascular:     Rate and Rhythm: Tachycardia present.     Pulses: Normal pulses.  Pulmonary:     Effort: Pulmonary effort is normal. No respiratory distress.  Abdominal:     General: Abdomen is flat. There is no distension.   Skin:    General: Skin is dry.     Capillary Refill: Capillary refill takes less than 2 seconds.   Neurological:     General: No focal deficit present.     Mental Status: She is alert.     Procedures    ED Course / MDM    Medical Decision Making I took over the patient at 5 PM.  She is hypertensive and tachycardic and received a second bolus of IV fluids.  Hypertension resolved although she has mild leukocytosis and I assume she is very dehydrated.  Alcohol level ordered and UDS ordered as well.  Patient/anxious yesterday.  I think she is withdrawing from alcohol heart rate fluctuates between 115 and 147 she changes positions.  Discussed patient's case with her specifically admitted for further workup and management.  Patient and family at bedside are agreeable with the plan.  Problems Addressed: AKI (acute kidney injury) [facility]): acute illness or injury that poses a threat to life or bodily functions Alcohol withdrawal syndrome without complication (HCC): acute illness or injury that poses a threat to life or bodily functions Dehydration: acute illness or injury  Amount and/or Complexity of Data Reviewed External Data Reviewed: notes. Labs: ordered. Decision-making details documented in ED Course.  Risk OTC drugs. Prescription drug management. Parenteral controlled substances. Drug therapy requiring intensive monitoring for  toxicity. Decision regarding hospitalization. Diagnosis or treatment significantly limited by social determinants of health.   I took over this patient at 5 pm        Gennaro Duwaine CROME, DO 04/28/24 2044

## 2024-04-28 NOTE — H&P (Signed)
 History and Physical  Kimisha Eunice FMW:[id number] DOB: 1986-09-17 DOA: 04/28/2024  PCP: Leonel Cole, MD   Chief Complaint: Nausea and vomiting  HPI: Anne Stewart is a 38 y.o. female with medical history significant for alcohol use disorder, mood disorder and suicidal ideation who presented to the ED for evaluation of nausea and vomiting.  ED Course: Initial vitals show temp 98.1, RR 20, HR 127, BP 128/106, SpO2 99% on room air. Initial labs significant for WBC 14.3, Hgb 11.1, platelet 371, CMP aside hemolyzed sample but shows sodium 132, K+ 4.9, bicarb 20, glucose 164, creatinine 1.20, AST/ALT 43/22, alk phos 97 and bilirubin 1.4, lipase 54.  Pt received IV LR 1 L bolus x 2, IV Ativan  1 mg x 2 and IV Reglan 10 mg x 1. Patient was started on CIWA protocol with Ativan  and [facility] was consulted for admission.   Review of Systems: Please see HPI for pertinent positives and negatives. A complete 10 system review of systems are otherwise negative.  Past Medical History:  Diagnosis Date   Alcohol use disorder    Generalized anxiety disorder    Substance use disorder    History reviewed. No pertinent surgical history. Social History:  reports that she has never smoked. She does not have any smokeless tobacco history on file. She reports current alcohol use. She reports that she does not currently use drugs.  No Known Allergies  History reviewed. No pertinent family history.   Prior to Admission medications   Medication Sig Start Date End Date Taking? Authorizing Provider  amphetamine-dextroamphetamine (ADDERALL) 20 MG tablet Take 20 mg by mouth 3 (three) times daily as needed. 03/22/24   [provider]  busPIRone  (BUSPAR ) 10 MG tablet Take 1 tablet (10 mg total) by mouth 3 (three) times daily. 01/30/23   Goli, Veeraindar, MD  gabapentin  (NEURONTIN ) 800 MG tablet Take 800 mg by mouth 4 (four) times daily. 02/07/24   [provider]  propranolol  (INDERAL ) 10 MG tablet Take  1 tablet (10 mg total) by mouth 3 (three) times daily. 01/30/23   Goli, Veeraindar, MD  traZODone  (DESYREL ) 50 MG tablet Take 1 tablet (50 mg total) by mouth at bedtime as needed and may repeat dose one time if needed for sleep. 01/30/23   Larina Fling, MD    Physical Exam: BP (!) 139/108 (BP Location: Left Arm)   Pulse (!) 114   Temp 98.1 F (36.7 C) (Oral)   Resp 17   LMP 03/26/2024   SpO2 100%  General: Pleasant, well-appearing *** laying in bed. No acute distress. HEENT: [place]/AT. Anicteric sclera CV: RRR. No murmurs, rubs, or gallops. No LE edema Pulmonary: Lungs CTAB. Normal effort. No wheezing or rales. Abdominal: Soft, nontender, nondistended. Normal bowel sounds. Extremities: Palpable radial and DP pulses. Normal ROM. Skin: Warm and dry. No obvious rash or lesions. Neuro: A&Ox3. Moves all extremities. Normal sensation to light touch. No focal deficit. Psych: Normal mood and affect          Labs on Admission:  Basic Metabolic Panel: Recent Labs  Lab 04/28/24 1438  NA 132*  K 4.9  CL 96*  CO2 20*  GLUCOSE 164*  BUN 12  CREATININE 1.20*  CALCIUM 8.8*   Liver Function Tests: Recent Labs  Lab 04/28/24 1438  AST 43*  ALT 22  ALKPHOS 97  BILITOT 1.4*  PROT 7.8  ALBUMIN 4.7   Recent Labs  Lab 04/28/24 1438  LIPASE 54*   No results for input(s): AMMONIA in the  last 168 hours. CBC: Recent Labs  Lab 04/28/24 1438  WBC 14.3*  NEUTROABS 12.6*  HGB 11.1*  HCT 32.5*  MCV 89.3  PLT 371   Cardiac Enzymes: No results for input(s): CKTOTAL, CKMB, CKMBINDEX, TROPONINI in the last 168 hours. BNP (last 3 results) No results for input(s): BNP in the last 8760 hours.  ProBNP (last 3 results) No results for input(s): PROBNP in the last 8760 hours.  CBG: No results for input(s): GLUCAP in the last 168 hours.  Radiological Exams on Admission: No results found. Assessment/Plan Arnelle Nale is a 38 y.o. female with medical history  significant for alcohol use disorder, mood disorder and suicidal ideation who presented to the ED for evaluation of nausea and vomiting and admitted for alcohol withdrawal  # Alcohol withdrawal # Alcohol use disorder  # Mood disorder  #***  #***  #***  #***  #***  DVT prophylaxis: Lovenox     Code Status: Prior  Consults called: None  Family Communication: Discussed admission with boyfriend at bedside  Severity of Illness: The appropriate patient status for this patient is INPATIENT. Inpatient status is judged to be reasonable and necessary in order to provide the required intensity of service to ensure the patient's safety. The patient's presenting symptoms, physical exam findings, and initial radiographic and laboratory data in the context of their chronic comorbidities is felt to place them at high risk for further clinical deterioration. Furthermore, it is not anticipated that the patient will be medically stable for discharge from the hospital within 2 midnights of admission.   * I certify that at the point of admission it is my clinical judgment that the patient will require inpatient hospital care spanning beyond 2 midnights from the point of admission due to high intensity of service, high risk for further deterioration and high frequency of surveillance required.*  Level of care: Stepdown   This record has been created using Conservation officer, historic buildings. Errors have been sought and corrected, but may not always be located. Such creation errors do not reflect on the standard of care.   Lou Claretta HERO, MD 04/28/2024, 7:49 PM [facility] Pager: [phone] Isaiah 41:10   If 7PM-7AM, please contact night-coverage [email or web address] Password [email or web address]

## 2024-04-28 NOTE — ED Triage Notes (Signed)
 A/O x4. Appearing anxious. EMS reported pt is going through alcohol W/D. Last drink 6/26 at 12pm, 2 bottles of wine. EMS administered 5mg  of zofran . Usually has 2 bottles of wine daily. Hx of anxiety & depression. Pt attempting to quit drinking causing the W/D. EMS reported BP 140/92, O2 96, HR 120-117, 226 CBG.

## 2024-04-29 DIAGNOSIS — F10930 Alcohol use, unspecified with withdrawal, uncomplicated: Secondary | ICD-10-CM | POA: Diagnosis not present

## 2024-04-29 DIAGNOSIS — N179 Acute kidney failure, unspecified: Secondary | ICD-10-CM

## 2024-04-29 DIAGNOSIS — E860 Dehydration: Secondary | ICD-10-CM

## 2024-04-29 LAB — CBC
HCT: 34 % — ABNORMAL LOW (ref 36.0–46.0)
Hemoglobin: 11.2 g/dL — ABNORMAL LOW (ref 12.0–15.0)
MCH: 31.2 pg (ref 26.0–34.0)
MCHC: 32.9 g/dL (ref 30.0–36.0)
MCV: 94.7 fL (ref 80.0–100.0)
Platelets: 336 10*3/uL (ref 150–400)
RBC: 3.59 MIL/uL — ABNORMAL LOW (ref 3.87–5.11)
RDW: 15.4 % (ref 11.5–15.5)
WBC: 15.7 10*3/uL — ABNORMAL HIGH (ref 4.0–10.5)
nRBC: 0 % (ref 0.0–0.2)

## 2024-04-29 LAB — HIV ANTIBODY (ROUTINE TESTING W REFLEX): HIV Screen 4th Generation wRfx: NONREACTIVE

## 2024-04-29 LAB — BASIC METABOLIC PANEL WITH GFR
Anion gap: 16 — ABNORMAL HIGH (ref 5–15)
BUN: 8 mg/dL (ref 6–20)
CO2: 25 mmol/L (ref 22–32)
Calcium: 9.7 mg/dL (ref 8.9–10.3)
Chloride: 96 mmol/L — ABNORMAL LOW (ref 98–111)
Creatinine, Ser: 1.08 mg/dL — ABNORMAL HIGH (ref 0.44–1.00)
GFR, Estimated: >60 mL/min (ref >60)
Glucose, Bld: 123 mg/dL — ABNORMAL HIGH (ref 70–99)
Potassium: 3.4 mmol/L — ABNORMAL LOW (ref 3.5–5.1)
Sodium: 137 mmol/L (ref 135–145)

## 2024-04-29 LAB — RAPID URINE DRUG SCREEN, HOSP PERFORMED
Amphetamines: NOT DETECTED
Barbiturates: NOT DETECTED
Benzodiazepines: NOT DETECTED
Cocaine: NOT DETECTED
Opiates: NOT DETECTED
Tetrahydrocannabinol: POSITIVE — AB

## 2024-04-29 LAB — URINALYSIS, ROUTINE W REFLEX MICROSCOPIC
Bilirubin Urine: NEGATIVE
Glucose, UA: 50 mg/dL — AB
Ketones, ur: 20 mg/dL — AB
Nitrite: NEGATIVE
Protein, ur: NEGATIVE mg/dL
Specific Gravity, Urine: 1.005 (ref 1.005–1.030)
pH: 7 (ref 5.0–8.0)

## 2024-04-29 LAB — PHOSPHORUS: Phosphorus: 2.5 mg/dL (ref 2.5–4.6)

## 2024-04-29 LAB — MAGNESIUM: Magnesium: 2.3 mg/dL (ref 1.7–2.4)

## 2024-04-29 MED ORDER — ACETAMINOPHEN 325 MG PO TABS
650.0000 mg | ORAL_TABLET | Freq: Four times a day (QID) | ORAL | Status: DC | PRN
  Administered 2024-04-29: 650 mg via ORAL
  Filled 2024-04-29: qty 2

## 2024-04-29 MED ORDER — SENNOSIDES-DOCUSATE SODIUM 8.6-50 MG PO TABS
1.0000 | ORAL_TABLET | Freq: Every evening | ORAL | Status: DC | PRN
Start: 2024-04-29 — End: 2024-04-29

## 2024-04-29 MED ORDER — ACETAMINOPHEN 650 MG RE SUPP: 650.0000 mg | Freq: Four times a day (QID) | RECTAL | Status: DC | PRN

## 2024-04-29 MED ORDER — MELATONIN 3 MG PO TABS
3.0000 mg | ORAL_TABLET | Freq: Every day | ORAL | Status: DC
  Administered 2024-04-29: 3 mg via ORAL
  Filled 2024-04-29: qty 1

## 2024-04-29 MED ORDER — ONDANSETRON HCL 4 MG PO TABS: 4.0000 mg | ORAL_TABLET | Freq: Four times a day (QID) | ORAL | Status: DC | PRN

## 2024-04-29 MED ORDER — ENOXAPARIN SODIUM 40 MG/0.4ML IJ SOSY
40.0000 mg | PREFILLED_SYRINGE | INTRAMUSCULAR | Status: DC
  Administered 2024-04-29: 40 mg via SUBCUTANEOUS
  Filled 2024-04-29: qty 0.4

## 2024-04-29 MED ORDER — ORAL CARE MOUTH RINSE: 15.0000 mL | OROMUCOSAL | Status: DC | PRN

## 2024-04-29 MED ORDER — ONDANSETRON HCL 4 MG/2ML IJ SOLN
4.0000 mg | Freq: Four times a day (QID) | INTRAMUSCULAR | Status: DC | PRN
  Administered 2024-04-29: 4 mg via INTRAVENOUS
  Filled 2024-04-29: qty 2

## 2024-04-29 MED ORDER — HYDROXYZINE HCL 25 MG PO TABS
25.0000 mg | ORAL_TABLET | Freq: Three times a day (TID) | ORAL | Status: DC | PRN
  Administered 2024-04-29: 25 mg via ORAL
  Filled 2024-04-29: qty 1

## 2024-04-29 MED ORDER — POTASSIUM CHLORIDE CRYS ER 20 MEQ PO TBCR
60.0000 meq | EXTENDED_RELEASE_TABLET | Freq: Once | ORAL | Status: AC
  Administered 2024-04-29: 60 meq via ORAL
  Filled 2024-04-29: qty 3

## 2024-04-29 MED ORDER — SODIUM CHLORIDE 0.9 % IV SOLN: INTRAVENOUS | Status: AC

## 2024-04-29 NOTE — Plan of Care (Signed)
  Problem: Clinical Measurements: Goal: Ability to maintain clinical measurements within normal limits will improve Outcome: Progressing Goal: Will remain free from infection Outcome: Progressing Goal: Diagnostic test results will improve Outcome: Progressing Goal: Cardiovascular complication will be avoided Outcome: Progressing   Problem: Activity: Goal: Risk for activity intolerance will decrease Outcome: Progressing   Problem: Nutrition: Goal: Adequate nutrition will be maintained Outcome: Progressing   Problem: Coping: Goal: Level of anxiety will decrease Outcome: Progressing   Problem: Elimination: Goal: Will not experience complications related to bowel motility Outcome: Progressing Goal: Will not experience complications related to urinary retention Outcome: Progressing   Problem: Safety: Goal: Ability to remain free from injury will improve Outcome: Progressing

## 2024-04-29 NOTE — Hospital Course (Signed)
 38 y.o. female with medical history significant for alcohol use disorder, mood disorder and suicidal ideation who presented to the ED for evaluation of nausea and vomiting. Patient reports she has had trouble with alcohol for most of her life. She has had inpatient treatment and most recently getting intensive outpatient treatment at [facility]. Patient reports that this past week, she is increased her drinking and drinking almost 1.5 L of wine every 10 hours. When asked what caused the increase in her drinking, patient states she does not want to talk about it. Her last drink was 12 PM on 6/26. She woke up this morning with significant nausea and vomiting, tremors, sweats and abdominal discomfort.  She reports a history of alcohol withdrawal but denies any history of DT.  She denies any fevers, chills, shortness of breath, chest pain or dysuria. She also denies any suicidal ideations.

## 2024-04-29 NOTE — Discharge Summary (Signed)
 Physician Discharge Summary   Patient: Anne Stewart MRN: [id number] DOB: Jan 10, 1986  Admit date:     04/28/2024  Discharge date: 04/29/24  Discharge Physician: Anne Stewart   PCP: Anne Cole, MD   Recommendations at discharge:    Follow up with PCP in 1-2 weeks  Discharge Diagnoses: Principal Problem:   Alcohol withdrawal (HCC) Active Problems:   Dehydration  Resolved Problems:   * No resolved hospital problems. *  Hospital Course: 38 y.o. female with medical history significant for alcohol use disorder, mood disorder and suicidal ideation who presented to the ED for evaluation of nausea and vomiting. Patient reports she has had trouble with alcohol for most of her life. She has had inpatient treatment and most recently getting intensive outpatient treatment at the [facility]. Patient reports that this past week, she is increased her drinking and drinking almost 1.5 L of wine every 10 hours. When asked what caused the increase in her drinking, patient states she does not want to talk about it. Her last drink was 12 PM on 6/26. She woke up this morning with significant nausea and vomiting, tremors, sweats and abdominal discomfort.  She reports a history of alcohol withdrawal but denies any history of DT.  She denies any fevers, chills, shortness of breath, chest pain or dysuria. She also denies any suicidal ideations.   Assessment and Plan: # Alcohol withdrawal # Alcohol use disorder - Patient with severe EtOH use disorder presented with acute onset of nausea, vomiting and abdominal pain - Reports drinking 1.5 L of wine every 10 hours over the last week, negative ethanol levels at time of presentation - Patient found to be tachycardic, anxious, and hypertensive with CIWA score of 14 on admission - CIWA score improved overnight with ativan  -Pt remained stable with minimal CIWA   # Mood disorder - Patient reports she has not been taking her medications over the last week -  She denies any suicidal ideation or attempt - Started hydroxyzine  25 mg as needed for anxiety this visit - follow up as outpatient      Consultants:  Procedures performed:   Disposition: Home Diet recommendation:  Regular diet DISCHARGE MEDICATION: Allergies as of 04/29/2024   No Known Allergies      Medication List     STOP taking these medications    busPIRone  10 MG tablet Commonly known as: BUSPAR        TAKE these medications    amphetamine-dextroamphetamine 20 MG tablet Commonly known as: ADDERALL Take 20 mg by mouth 3 (three) times daily as needed.   gabapentin  800 MG tablet Commonly known as: NEURONTIN  Take 800 mg by mouth 4 (four) times daily.   propranolol  10 MG tablet Commonly known as: INDERAL  Take 1 tablet (10 mg total) by mouth 3 (three) times daily.   traZODone  50 MG tablet Commonly known as: DESYREL  Take 1 tablet (50 mg total) by mouth at bedtime as needed and may repeat dose one time if needed for sleep.        Follow-up Information     Anne Cole, MD Follow up in 2 week(s).   Specialty: Family Medicine Why: Hospital follow up Contact information: [address] [place] [address] [phone]                Discharge Exam: Anne Stewart   04/28/24 2000  Weight: 66.3 kg   General exam: Awake, laying in bed, in nad Respiratory system: Normal respiratory effort, no wheezing Cardiovascular  system: regular rate, s1, s2 Gastrointestinal system: Soft, nondistended, positive BS Central nervous system: CN2-12 grossly intact, strength intact Extremities: Perfused, no clubbing Skin: Normal skin turgor, no notable skin lesions seen Psychiatry: Mood normal // no visual hallucinations   Condition at discharge: fair  The results of significant diagnostics from this hospitalization (including imaging, microbiology, ancillary and laboratory) are listed below for reference.   Imaging Studies: No results  found.  Microbiology: Results for orders placed or performed during the hospital encounter of 04/28/24  MRSA Next Gen by PCR, Nasal     Status: Abnormal   Collection Time: 04/28/24  8:43 PM   Specimen: Nasal Mucosa; Nasal Swab  Result Value Ref Range Status   MRSA by PCR Next Gen DETECTED (A) NOT DETECTED Final    Comment: CRITICAL RESULT CALLED TO, READ BACK BY AND VERIFIED WITH: Stewart, T. RN AT 04/28/2024 2314 BY JEREMY C (NOTE) The GeneXpert MRSA Assay (FDA approved for NASAL specimens only), is one component of a comprehensive MRSA colonization surveillance program. It is not intended to diagnose MRSA infection nor to guide or monitor treatment for MRSA infections. Test performance is not FDA approved in patients less than 53 years old. Performed at [facility], [address], [place], [place] [address]     Labs: CBC: Recent Labs  Lab 04/28/24 1438 04/29/24 0302  WBC 14.3* 15.7*  NEUTROABS 12.6*  --   HGB 11.1* 11.2*  HCT 32.5* 34.0*  MCV 89.3 94.7  PLT 371 336   Basic Metabolic Panel: Recent Labs  Lab 04/28/24 1438 04/28/24 2113 04/29/24 0302  NA 132* 137 137  K 4.9 3.6 3.4*  CL 96* 95* 96*  CO2 20* 24 25  GLUCOSE 164* 118* 123*  BUN 12 8 8   CREATININE 1.20* 1.00 1.08*  CALCIUM 8.8* 9.3 9.7  MG  --   --  2.3  PHOS  --   --  2.5   Liver Function Tests: Recent Labs  Lab 04/28/24 1438 04/28/24 2113  AST 43* 33  ALT 22 22  ALKPHOS 97 98  BILITOT 1.4* 0.9  PROT 7.8 7.8  ALBUMIN 4.7 4.7   CBG: No results for input(s): GLUCAP in the last 168 hours.  Discharge time spent: less than 30 minutes.  Signed: Garnette Pelt, MD [facility] 04/29/2024
